# Patient Record
Sex: Female | Born: 2005 | Race: White | Hispanic: No | Marital: Single | Smoking: Never smoker
Health system: Southern US, Community
[De-identification: ages and names within clinical notes are randomized; demographics above are authoritative.]

## PROBLEM LIST (undated history)

## (undated) DIAGNOSIS — T1490XA Injury, unspecified, initial encounter: Secondary | ICD-10-CM

## (undated) DIAGNOSIS — N12 Tubulo-interstitial nephritis, not specified as acute or chronic: Secondary | ICD-10-CM

## (undated) DIAGNOSIS — F329 Major depressive disorder, single episode, unspecified: Secondary | ICD-10-CM

## (undated) HISTORY — DX: Tubulo-interstitial nephritis, not specified as acute or chronic: N12

## (undated) HISTORY — DX: Injury, unspecified, initial encounter: T14.90XA

---
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
## 2023-03-21 IMAGING — CT CT ABD-PELV W/ CM
2 of 4 series · 15 of 46 positions shown, 17 images · IV contrast (Omnipaque or Isovue)
Comparison: None.

CLINICAL DATA: Left flank pain, urinary tract infection

EXAM:
CT ABDOMEN AND PELVIS WITH CONTRAST
TECHNIQUE: Multidetector CT imaging of the abdomen and pelvis was performed
using the standard protocol following bolus administration of
intravenous contrast.
CONTRAST:  75mL OMNIPAQUE IOHEXOL 300 MG/ML  SOLN

[Series 2: axial st · axial · 0.73mm/px · z∈[+714,+1189]mm · 12 of 109 slices shown, 14 images]
[im 9/109  soft-tissue]
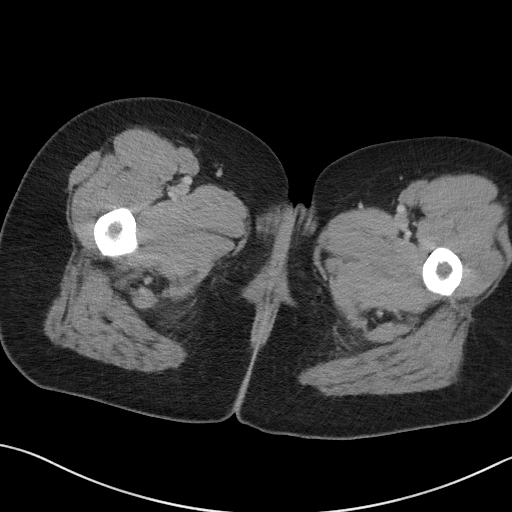
[im 9/109  bone]
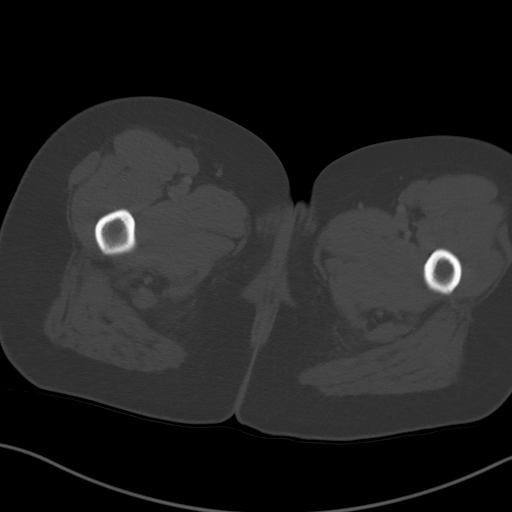
[im 18/109  soft-tissue]
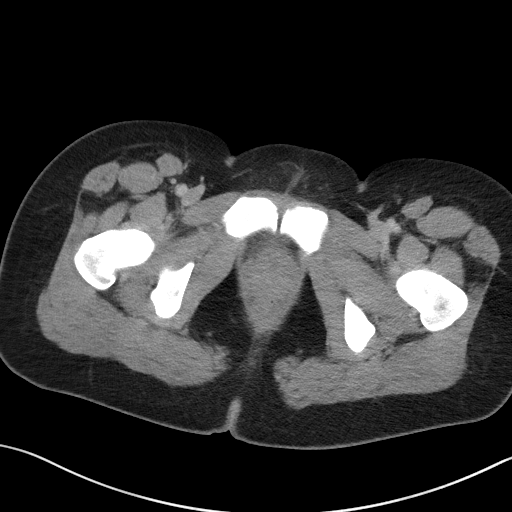
[im 26/109  soft-tissue]
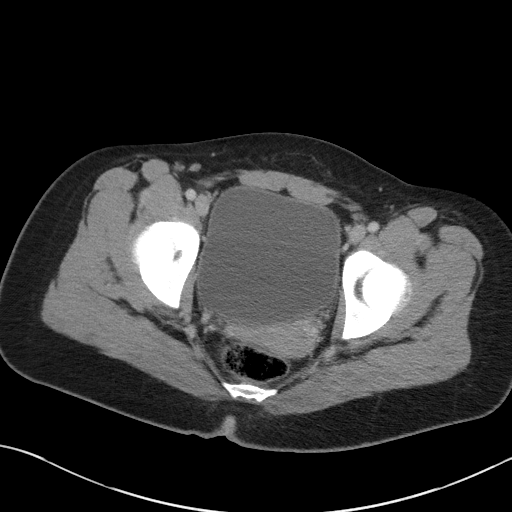
[im 35/109  soft-tissue]
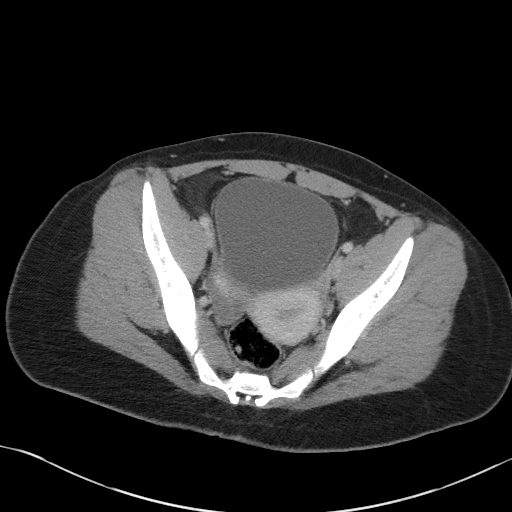
[im 44/109  soft-tissue]
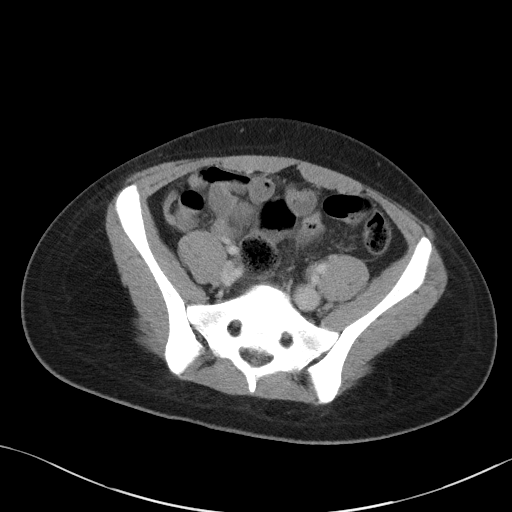
[im 52/109  soft-tissue]
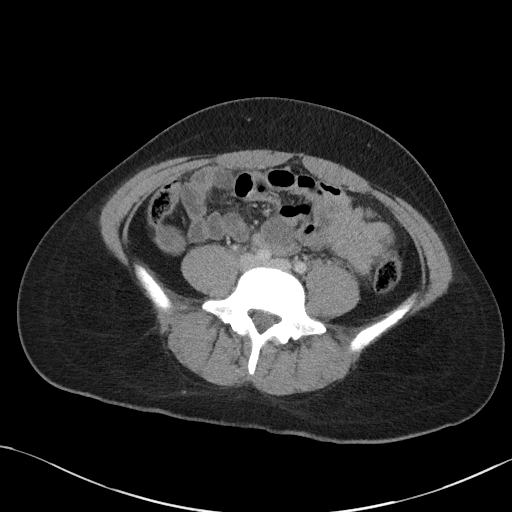
[im 61/109  soft-tissue]
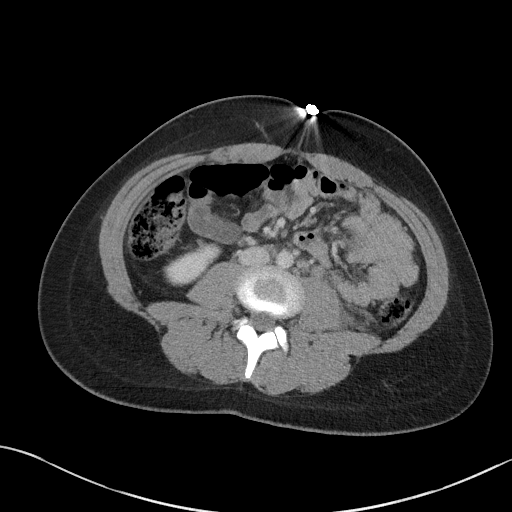
[im 70/109  soft-tissue]
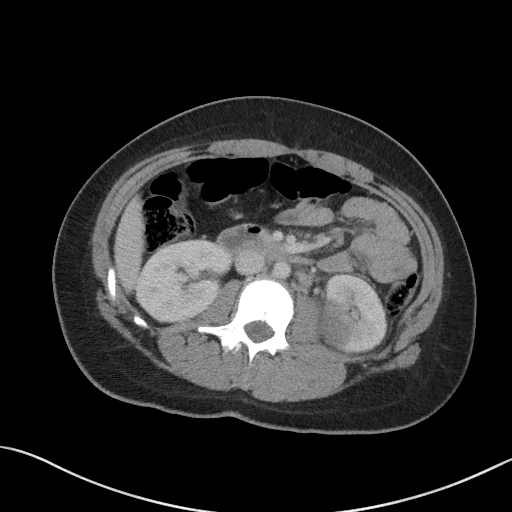
[im 78/109  soft-tissue]
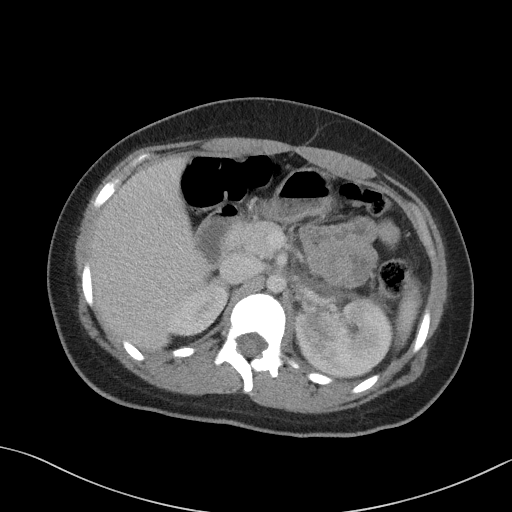
[im 78/109  bone]
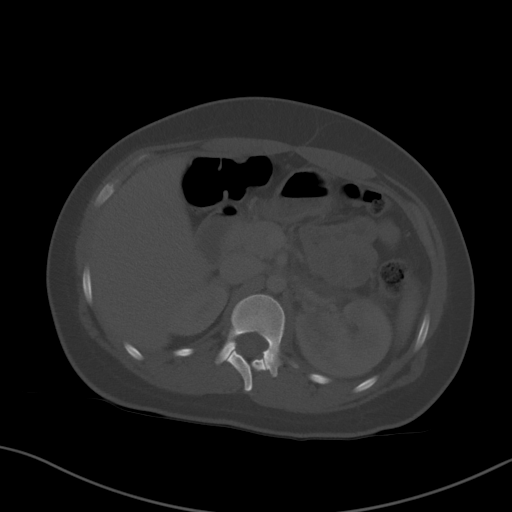
[im 87/109  soft-tissue]
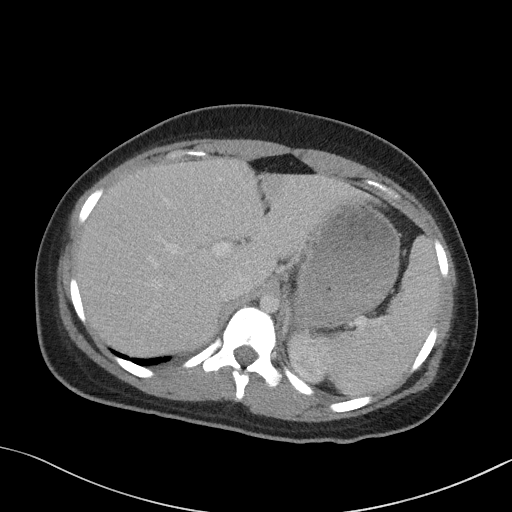
[im 96/109  soft-tissue]
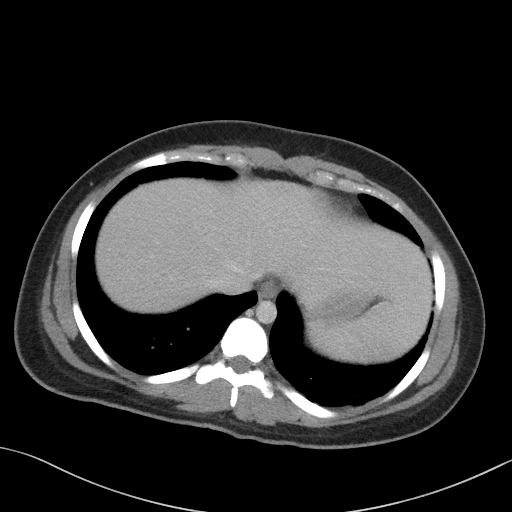
[im 104/109  soft-tissue]
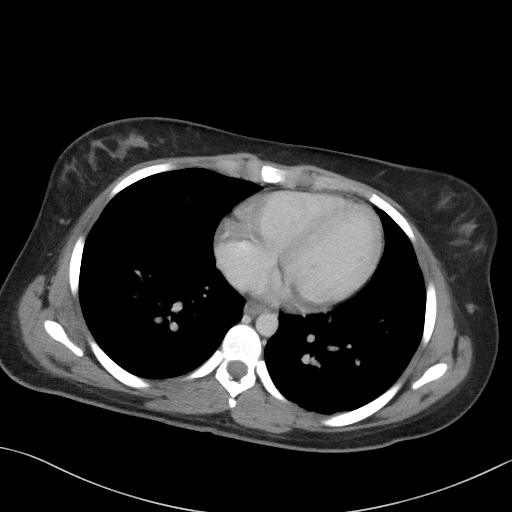

[Series 5: coronal st · coronal · 0.73mm/px · 3 of 104 slices shown]
[im 35/104  soft-tissue]
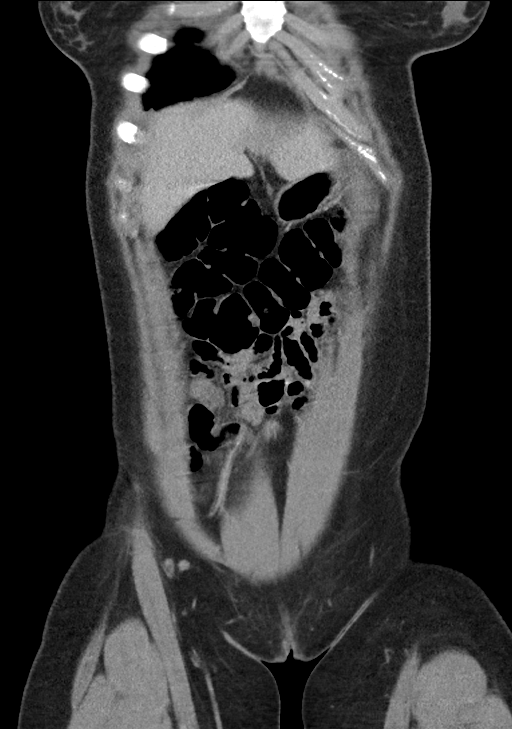
[im 46/104  soft-tissue]
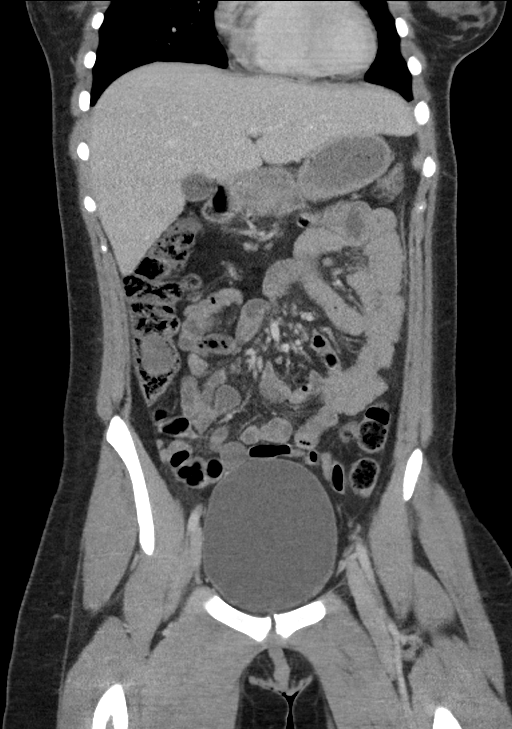
[im 58/104  soft-tissue]
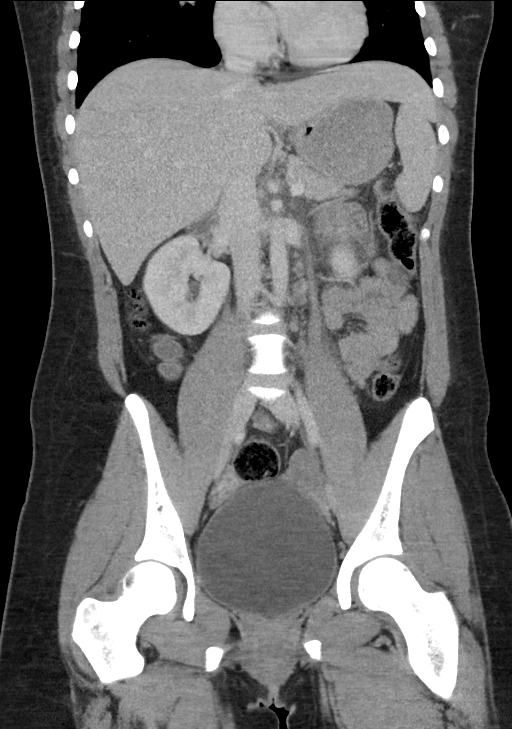

[15 of 46 positions shown; findings below may reference images not displayed]

FINDINGS: Lower chest: No acute pleural or parenchymal lung disease.

Hepatobiliary: No focal liver abnormality is seen. No gallstones,
gallbladder wall thickening, or biliary dilatation.

Pancreas: Unremarkable. No pancreatic ductal dilatation or
surrounding inflammatory changes.

Spleen: Normal in size without focal abnormality.

Adrenals/Urinary Tract: There is heterogeneous decreased enhancement
of the left kidney, greatest in the mid and lower pole, consistent
with pyelonephritis. Marked left perinephric fat stranding. No
evidence of abscess.

The right kidney enhances normally. No urinary tract calculi or
obstructive uropathy. The adrenals and bladder are unremarkable.

Stomach/Bowel: No bowel obstruction or ileus. Normal appendix right
lower quadrant. No bowel wall thickening or inflammatory change.

Vascular/Lymphatic: Multiple subcentimeter lymph nodes are seen in
the left para-aortic region, likely reactive given adjacent
pyelonephritis. No pathologically enlarged lymph nodes. No
significant vascular findings.

Reproductive: Uterus and bilateral adnexa are unremarkable.

Other: No free fluid or free gas.  No abdominal wall hernia.

Musculoskeletal: No acute or destructive bony lesions. Reconstructed
images demonstrate no additional findings.
IMPRESSION: 1. Acute left pyelonephritis, with areas of likely lobar nephronia
within the mid and lower poles of the left kidney. No evidence of
renal abscess.
2. No evidence of urinary tract calculi or obstructive uropathy.
3. Reactive subcentimeter lymph nodes in the left para-aortic
region. No pathologically enlarged nodes.

## 2023-05-20 ENCOUNTER — Encounter: Payer: Self-pay | Admitting: *Deleted

## 2023-05-28 ENCOUNTER — Ambulatory Visit: Payer: MEDICAID

## 2023-05-28 ENCOUNTER — Ambulatory Visit
Admission: EM | Admit: 2023-05-28 | Discharge: 2023-05-28 | Disposition: A | Discharge status: Home / Self Care | Payer: MEDICAID | Attending: Family Medicine | Admitting: Family Medicine

## 2023-05-28 DIAGNOSIS — M79641 Pain in right hand: Secondary | ICD-10-CM

## 2023-05-28 NOTE — ED Triage Notes (Signed)
Pt reports she punched the floor and the wall about an hour ago due to being angry.

## 2023-05-28 NOTE — Discharge Instructions (Signed)
We have placed a compression wrap to help with pain and stability of the area.  You may ice, elevate and take ibuprofen and Tylenol for pain.  I will call you tomorrow morning if anything comes back abnormal on your x-ray.

## 2023-05-28 NOTE — ED Provider Notes (Signed)
[facility]    CSN: [id number] Arrival date & time: 05/28/23  1846      History   Chief Complaint No chief complaint on file.   HPI Gabriela Allison is a 17 y.o. female.   Patient presenting today with 1 day history of right hand and wrist pain following punching a wall and a floor earlier today.  Denies loss of range of motion, discoloration, numbness, tingling, skin injury.  So far not tried anything over-the-counter for symptoms.    Past Medical History:  Diagnosis Date   Anxiety state 11/24/2018   MDD (major depressive disorder)    Nexplanon insertion 03/19/2021   Pyelonephritis     Patient Active Problem List   Diagnosis Date Noted   BMI 36.0-36.9,adult 10/28/2022   Pyelonephritis 04/01/2021   Nexplanon insertion 03/19/2021   Pregnancy test negative 02/12/2021   Frequent UTI 02/12/2021   Screen for STD (sexually transmitted disease) 02/12/2021   Encounter for initial prescription of contraceptive pills 02/12/2021   Major depressive disorder 10/31/2020   Suicidal ideation 06/20/2020   Self-injurious behavior 06/20/2020   MDD (major depressive disorder), recurrent severe, 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 No pertinent surgical history.  OB History     Gravida  0   Para  0   Term  0   Preterm  0   AB  0   Living  0      SAB  0   IAB  0   Ectopic  0   Multiple  0   Live Births  0            Home Medications    Prior to Admission medications   Medication Sig Start Date End Date Taking? Authorizing Provider  citalopram (CELEXA) 20 MG tablet Take 20 mg by mouth daily. 09/22/22   [provider]  cloNIDine (CATAPRES) 0.1 MG tablet Take 0.1 mg by mouth daily. 10/22/22 11/21/22  [provider]  etonogestrel (NEXPLANON) 68 MG IMPL implant 1 each by Subdermal route  once.    [provider]  lamoTRIgine (LAMICTAL) 25 MG tablet Take 25 mg by mouth 2 (two) times daily. 10/22/22 11/21/22  [provider]  Multiple Vitamin (MULTIVITAMIN) tablet Take 1 tablet by mouth daily.    [provider]    Family History Family History  Problem Relation Age of Onset   Other Mother        substance abuse   Other Father        substance abuse   Hypertension Maternal Grandmother    Heart disease Maternal Grandfather        ?cardiomegaly   Hypertension Paternal Grandfather    Asthma Paternal Grandfather    Hypothyroidism Paternal Grandmother    Migraines Paternal Grandmother    Healthy Sister    Healthy Sister    Seizures Neg Hx    Autism Neg Hx    ADD / ADHD Neg Hx    Anxiety disorder Neg Hx    Depression Neg Hx    Bipolar disorder Neg Hx    Schizophrenia Neg Hx     Social History Social History   Tobacco Use   Smoking status: Never   Smokeless tobacco: Never  Vaping Use   Vaping status: Never Used  Substance Use Topics   Alcohol use: Not Currently    Comment: occasionally  Drug use: Not Currently    Types: Marijuana    Comment: over a month ago     Allergies   Patient has no known allergies.   Review of Systems Review of Systems Per HPI  Physical Exam Triage Vital Signs ED Triage Vitals  Encounter Vitals Group     BP 05/28/23 1916 121/84     Systolic BP Percentile --      Diastolic BP Percentile --      Pulse Rate 05/28/23 1916 79     Resp 05/28/23 1916 20     Temp 05/28/23 1916 98.7 F (37.1 C)     Temp Source 05/28/23 1916 Oral     SpO2 05/28/23 1916 99 %     Weight --      Height --      Head Circumference --      Peak Flow --      Pain Score 05/28/23 1912 4     Pain Loc --      Pain Education --      Exclude from Growth Chart --    No data found.  Updated Vital Signs BP 121/84 (BP Location: Right Arm)   Pulse 79   Temp 98.7 F (37.1 C) (Oral)   Resp 20   LMP  (LMP Unknown)   SpO2  99%   Visual Acuity Right Eye Distance:   Left Eye Distance:   Bilateral Distance:    Right Eye Near:   Left Eye Near:    Bilateral Near:     Physical Exam Vitals and nursing note reviewed.  Constitutional:      Appearance: Normal appearance. She is not ill-appearing.  HENT:     Head: Atraumatic.  Eyes:     Extraocular Movements: Extraocular movements intact.     Conjunctiva/sclera: Conjunctivae normal.  Cardiovascular:     Rate and Rhythm: Normal rate and regular rhythm.     Heart sounds: Normal heart sounds.  Pulmonary:     Effort: Pulmonary effort is normal.     Breath sounds: Normal breath sounds.  Musculoskeletal:        General: Tenderness and signs of injury present. No swelling or deformity. Normal range of motion.     Cervical back: Normal range of motion and neck supple.     Comments: Mild tenderness to palpation to the metacarpals of the right hand diffusely, no point tenderness, no deformities palpable  Skin:    General: Skin is warm and dry.     Findings: No bruising or erythema.  Neurological:     Mental Status: She is alert and oriented to person, place, and time.     Motor: No weakness.     Gait: Gait normal.     Comments: Right upper extremity neurovascularly intact  Psychiatric:        Mood and Affect: Mood normal.        Thought Content: Thought content normal.        Judgment: Judgment normal.      UC Treatments / Results  Labs (all labs ordered are listed, but only abnormal results are displayed) Labs Reviewed - No data to display  EKG   Radiology No results found.  Procedures Procedures (including critical care time)  Medications Ordered in UC Medications - No data to display  Initial Impression / Assessment and Plan / UC Course  I have reviewed the triage vital signs and the nursing notes.  Pertinent labs & imaging results that were  available during my care of the patient were reviewed by me and considered in my medical decision  making (see chart for details).     *** Final Clinical Impressions(s) / UC Diagnoses   Final diagnoses:  Right hand pain     Discharge Instructions      We have placed a compression wrap to help with pain and stability of the area.  You may ice, elevate and take ibuprofen and Tylenol for pain.  I will call you tomorrow morning if anything comes back abnormal on your x-ray.   ED Prescriptions   None    PDMP not reviewed this encounter.

## 2024-01-02 ENCOUNTER — Inpatient Hospital Stay (HOSPITAL_COMMUNITY)
Admission: EM | Admit: 2024-01-02 | Discharge: 2024-01-07 | DRG: 448 | Disposition: A | Discharge status: Home / Self Care | Payer: MEDICAID | Attending: General Surgery | Admitting: General Surgery

## 2024-01-02 ENCOUNTER — Emergency Department (HOSPITAL_COMMUNITY): Payer: MEDICAID

## 2024-01-02 ENCOUNTER — Encounter (HOSPITAL_COMMUNITY): Payer: Self-pay | Admitting: Emergency Medicine

## 2024-01-02 ENCOUNTER — Other Ambulatory Visit: Payer: Self-pay

## 2024-01-02 DIAGNOSIS — Y92410 Unspecified street and highway as the place of occurrence of the external cause: Secondary | ICD-10-CM

## 2024-01-02 DIAGNOSIS — S30810A Abrasion of lower back and pelvis, initial encounter: Secondary | ICD-10-CM | POA: Diagnosis present

## 2024-01-02 DIAGNOSIS — S82891A Other fracture of right lower leg, initial encounter for closed fracture: Secondary | ICD-10-CM

## 2024-01-02 DIAGNOSIS — Y901 Blood alcohol level of 20-39 mg/100 ml: Secondary | ICD-10-CM | POA: Diagnosis present

## 2024-01-02 DIAGNOSIS — F411 Generalized anxiety disorder: Secondary | ICD-10-CM | POA: Diagnosis present

## 2024-01-02 DIAGNOSIS — M549 Dorsalgia, unspecified: Secondary | ICD-10-CM | POA: Diagnosis present

## 2024-01-02 DIAGNOSIS — R202 Paresthesia of skin: Secondary | ICD-10-CM | POA: Diagnosis present

## 2024-01-02 DIAGNOSIS — R200 Anesthesia of skin: Secondary | ICD-10-CM | POA: Diagnosis present

## 2024-01-02 DIAGNOSIS — S80211A Abrasion, right knee, initial encounter: Secondary | ICD-10-CM | POA: Diagnosis present

## 2024-01-02 DIAGNOSIS — D62 Acute posthemorrhagic anemia: Secondary | ICD-10-CM | POA: Diagnosis not present

## 2024-01-02 DIAGNOSIS — Z981 Arthrodesis status: Principal | ICD-10-CM

## 2024-01-02 DIAGNOSIS — S32018A Other fracture of first lumbar vertebra, initial encounter for closed fracture: Secondary | ICD-10-CM | POA: Diagnosis present

## 2024-01-02 DIAGNOSIS — Z23 Encounter for immunization: Secondary | ICD-10-CM | POA: Diagnosis not present

## 2024-01-02 DIAGNOSIS — Z825 Family history of asthma and other chronic lower respiratory diseases: Secondary | ICD-10-CM | POA: Diagnosis not present

## 2024-01-02 DIAGNOSIS — S80212A Abrasion, left knee, initial encounter: Secondary | ICD-10-CM | POA: Diagnosis present

## 2024-01-02 DIAGNOSIS — S40211A Abrasion of right shoulder, initial encounter: Secondary | ICD-10-CM | POA: Diagnosis present

## 2024-01-02 DIAGNOSIS — Z793 Long term (current) use of hormonal contraceptives: Secondary | ICD-10-CM

## 2024-01-02 DIAGNOSIS — R780 Finding of alcohol in blood: Secondary | ICD-10-CM | POA: Diagnosis present

## 2024-01-02 DIAGNOSIS — S82841A Displaced bimalleolar fracture of right lower leg, initial encounter for closed fracture: Secondary | ICD-10-CM | POA: Diagnosis present

## 2024-01-02 DIAGNOSIS — D72829 Elevated white blood cell count, unspecified: Secondary | ICD-10-CM | POA: Diagnosis present

## 2024-01-02 DIAGNOSIS — F1090 Alcohol use, unspecified, uncomplicated: Secondary | ICD-10-CM | POA: Diagnosis present

## 2024-01-02 DIAGNOSIS — Z978 Presence of other specified devices: Secondary | ICD-10-CM

## 2024-01-02 DIAGNOSIS — Z8249 Family history of ischemic heart disease and other diseases of the circulatory system: Secondary | ICD-10-CM

## 2024-01-02 DIAGNOSIS — S32010A Wedge compression fracture of first lumbar vertebra, initial encounter for closed fracture: Secondary | ICD-10-CM | POA: Diagnosis present

## 2024-01-02 DIAGNOSIS — S50311A Abrasion of right elbow, initial encounter: Secondary | ICD-10-CM | POA: Diagnosis present

## 2024-01-02 LAB — COMPREHENSIVE METABOLIC PANEL WITH GFR
ALT: 23 U/L (ref 0–44)
AST: 35 U/L (ref 15–41)
Albumin: 4.4 g/dL (ref 3.5–5.0)
Alkaline Phosphatase: 43 U/L (ref 38–126)
Anion gap: 13 (ref 5–15)
BUN: 8 mg/dL (ref 6–20)
CO2: 18 mmol/L — ABNORMAL LOW (ref 22–32)
Calcium: 9.4 mg/dL (ref 8.9–10.3)
Chloride: 105 mmol/L (ref 98–111)
Creatinine, Ser: 0.73 mg/dL (ref 0.44–1.00)
GFR, Estimated: >60 mL/min (ref >60)
Glucose, Bld: 159 mg/dL — ABNORMAL HIGH (ref 70–99)
Potassium: 3.5 mmol/L (ref 3.5–5.1)
Sodium: 136 mmol/L (ref 135–145)
Total Bilirubin: 0.8 mg/dL (ref 0.0–1.2)
Total Protein: 7 g/dL (ref 6.5–8.1)

## 2024-01-02 LAB — I-STAT CHEM 8, ED
BUN: 8 mg/dL (ref 6–20)
Calcium, Ion: 1.17 mmol/L (ref 1.15–1.40)
Chloride: 105 mmol/L (ref 98–111)
Creatinine, Ser: 0.7 mg/dL (ref 0.44–1.00)
Glucose, Bld: 150 mg/dL — ABNORMAL HIGH (ref 70–99)
HCT: 51 % — ABNORMAL HIGH (ref 36.0–46.0)
Hemoglobin: 17.3 g/dL — ABNORMAL HIGH (ref 12.0–15.0)
Potassium: 3.6 mmol/L (ref 3.5–5.1)
Sodium: 139 mmol/L (ref 135–145)
TCO2: 18 mmol/L — ABNORMAL LOW (ref 22–32)

## 2024-01-02 LAB — CBC
HCT: 45.5 % (ref 36.0–46.0)
Hemoglobin: 15.7 g/dL — ABNORMAL HIGH (ref 12.0–15.0)
MCH: 32 pg (ref 26.0–34.0)
MCHC: 34.5 g/dL (ref 30.0–36.0)
MCV: 92.7 fL (ref 80.0–100.0)
Platelets: 277 10*3/uL (ref 150–400)
RBC: 4.91 MIL/uL (ref 3.87–5.11)
RDW: 12.2 % (ref 11.5–15.5)
WBC: 18 10*3/uL — ABNORMAL HIGH (ref 4.0–10.5)
nRBC: 0 % (ref 0.0–0.2)

## 2024-01-02 LAB — SAMPLE TO BLOOD BANK

## 2024-01-02 LAB — I-STAT CG4 LACTIC ACID, ED: Lactic Acid, Venous: 3.3 mmol/L (ref 0.5–1.9)

## 2024-01-02 LAB — PROTIME-INR
INR: 1 (ref 0.8–1.2)
Prothrombin Time: 13.2 s (ref 11.4–15.2)

## 2024-01-02 LAB — HCG, SERUM, QUALITATIVE: Preg, Serum: NEGATIVE

## 2024-01-02 LAB — ETHANOL: Alcohol, Ethyl (B): 28 mg/dL — ABNORMAL HIGH (ref <15)

## 2024-01-02 LAB — HIV ANTIBODY (ROUTINE TESTING W REFLEX): HIV Screen 4th Generation wRfx: NONREACTIVE

## 2024-01-02 MED ORDER — ONDANSETRON HCL 4 MG/2ML IJ SOLN
4.0000 mg | Freq: Four times a day (QID) | INTRAMUSCULAR | Status: DC | PRN
  Administered 2024-01-02 – 2024-01-03 (×2): 4 mg via INTRAVENOUS
  Filled 2024-01-02 (×2): qty 2

## 2024-01-02 MED ORDER — HYDRALAZINE HCL 20 MG/ML IJ SOLN: 10.0000 mg | INTRAMUSCULAR | Status: DC | PRN

## 2024-01-02 MED ORDER — FENTANYL CITRATE PF 50 MCG/ML IJ SOSY
PREFILLED_SYRINGE | INTRAMUSCULAR | Status: AC
  Filled 2024-01-02: qty 1

## 2024-01-02 MED ORDER — PANTOPRAZOLE SODIUM 40 MG IV SOLR
40.0000 mg | Freq: Every day | INTRAVENOUS | Status: DC
  Administered 2024-01-02: 40 mg via INTRAVENOUS
  Filled 2024-01-02: qty 10

## 2024-01-02 MED ORDER — HYDROMORPHONE HCL 1 MG/ML IJ SOLN
0.5000 mg | INTRAMUSCULAR | Status: DC | PRN
  Administered 2024-01-02 – 2024-01-03 (×4): 0.5 mg via INTRAVENOUS
  Filled 2024-01-02 (×4): qty 0.5

## 2024-01-02 MED ORDER — ONDANSETRON HCL 4 MG/2ML IJ SOLN
INTRAMUSCULAR | Status: AC
Start: 2024-01-02 — End: ?
  Filled 2024-01-02: qty 2

## 2024-01-02 MED ORDER — OXYCODONE HCL 5 MG PO TABS
10.0000 mg | ORAL_TABLET | ORAL | Status: DC | PRN
  Administered 2024-01-03 – 2024-01-06 (×10): 10 mg via ORAL
  Filled 2024-01-02 (×12): qty 2

## 2024-01-02 MED ORDER — METHOCARBAMOL 1000 MG/10ML IJ SOLN
1000.0000 mg | Freq: Three times a day (TID) | INTRAMUSCULAR | Status: DC
  Administered 2024-01-02 – 2024-01-03 (×2): 1000 mg via INTRAVENOUS
  Filled 2024-01-02 (×2): qty 10

## 2024-01-02 MED ORDER — ONDANSETRON HCL 4 MG/2ML IJ SOLN
4.0000 mg | Freq: Once | INTRAMUSCULAR | Status: AC
  Administered 2024-01-02: 4 mg via INTRAVENOUS

## 2024-01-02 MED ORDER — IOHEXOL 350 MG/ML SOLN
75.0000 mL | Freq: Once | INTRAVENOUS | Status: AC | PRN
  Administered 2024-01-02: 75 mL via INTRAVENOUS

## 2024-01-02 MED ORDER — FENTANYL CITRATE PF 50 MCG/ML IJ SOSY
PREFILLED_SYRINGE | INTRAMUSCULAR | Status: AC | PRN
  Administered 2024-01-02: 50 ug via INTRAVENOUS

## 2024-01-02 MED ORDER — FENTANYL CITRATE PF 50 MCG/ML IJ SOSY
100.0000 ug | PREFILLED_SYRINGE | Freq: Once | INTRAMUSCULAR | Status: AC
  Administered 2024-01-02: 100 ug via INTRAVENOUS
  Filled 2024-01-02: qty 2

## 2024-01-02 MED ORDER — GABAPENTIN 300 MG PO CAPS
300.0000 mg | ORAL_CAPSULE | Freq: Three times a day (TID) | ORAL | Status: DC
  Administered 2024-01-02 – 2024-01-06 (×10): 300 mg via ORAL
  Filled 2024-01-02: qty 1
  Filled 2024-01-02: qty 3
  Filled 2024-01-02 (×8): qty 1

## 2024-01-02 MED ORDER — DOCUSATE SODIUM 100 MG PO CAPS
100.0000 mg | ORAL_CAPSULE | Freq: Two times a day (BID) | ORAL | Status: DC
  Administered 2024-01-02 – 2024-01-07 (×8): 100 mg via ORAL
  Filled 2024-01-02 (×10): qty 1

## 2024-01-02 MED ORDER — ONDANSETRON 4 MG PO TBDP: 4.0000 mg | ORAL_TABLET | Freq: Four times a day (QID) | ORAL | Status: DC | PRN

## 2024-01-02 MED ORDER — POLYETHYLENE GLYCOL 3350 17 G PO PACK: 17.0000 g | PACK | Freq: Every day | ORAL | Status: DC | PRN

## 2024-01-02 MED ORDER — ACETAMINOPHEN 500 MG PO TABS
1000.0000 mg | ORAL_TABLET | Freq: Four times a day (QID) | ORAL | Status: DC
  Administered 2024-01-02 – 2024-01-03 (×3): 1000 mg via ORAL
  Filled 2024-01-02 (×3): qty 2

## 2024-01-02 MED ORDER — LACTATED RINGERS IV SOLN: INTRAVENOUS | Status: AC

## 2024-01-02 MED ORDER — ENOXAPARIN SODIUM 30 MG/0.3ML IJ SOSY
30.0000 mg | PREFILLED_SYRINGE | Freq: Two times a day (BID) | INTRAMUSCULAR | Status: DC
  Administered 2024-01-04 – 2024-01-07 (×6): 30 mg via SUBCUTANEOUS
  Filled 2024-01-02 (×6): qty 0.3

## 2024-01-02 MED ORDER — PANTOPRAZOLE SODIUM 40 MG PO TBEC
40.0000 mg | DELAYED_RELEASE_TABLET | Freq: Every day | ORAL | Status: DC
  Administered 2024-01-03 – 2024-01-07 (×5): 40 mg via ORAL
  Filled 2024-01-02 (×5): qty 1

## 2024-01-02 MED ORDER — OXYCODONE HCL 5 MG PO TABS
5.0000 mg | ORAL_TABLET | ORAL | Status: DC | PRN
  Administered 2024-01-02 – 2024-01-04 (×2): 5 mg via ORAL
  Filled 2024-01-02 (×2): qty 1

## 2024-01-02 MED ORDER — SODIUM CHLORIDE 0.9 % IV SOLN
INTRAVENOUS | Status: AC | PRN
  Administered 2024-01-02: 1000 mL via INTRAVENOUS

## 2024-01-02 MED ORDER — TETANUS-DIPHTH-ACELL PERTUSSIS 5-2.5-18.5 LF-MCG/0.5 IM SUSY
0.5000 mL | PREFILLED_SYRINGE | Freq: Once | INTRAMUSCULAR | Status: AC
  Administered 2024-01-02: 0.5 mL via INTRAMUSCULAR

## 2024-01-02 NOTE — Progress Notes (Signed)
 Orthopedic Tech Progress Note Patient Details:  Gabriela Allison [birth year] [id number]  Responded to level 1 trauma, not needed at this time  Patient ID: Gabriela Allison, female   DOB: [birth year], 18 y.o.   MRN: [id number]  Gabriela Allison 01/02/2024, 6:22 PM
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
## 2024-01-02 NOTE — Progress Notes (Signed)
 Orthopedic Tech Progress Note Patient Details:  Gabriela Allison 04/17/2006 [id number] Posterior and stirrup splint applied to patients RLE.   Ortho Devices Type of Ortho Device: Short leg splint, Stirrup splint Ortho Device/Splint Location: LRE Ortho Device/Splint Interventions: Ordered, Application   Post Interventions Patient Tolerated: Well  Nels Banda 01/02/2024, 7:05 PM

## 2024-01-02 NOTE — ED Notes (Signed)
 Patient transported to CT
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
## 2024-01-02 NOTE — ED Notes (Signed)
 Ortho tech at bedside applying ankle splint.
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
## 2024-01-02 NOTE — Progress Notes (Signed)
   01/02/24 1745  Spiritual Encounters  Type of Visit Attempt (pt unavailable)  Care provided to: Pt not available  Conversation partners present during encounter Nurse;Physician  Referral source Trauma page  Reason for visit Trauma  OnCall Visit Yes    Chaplain responded to level 1 page. Pt unavailable and no family present. Chaplain will continue to monitor and remains available.

## 2024-01-03 ENCOUNTER — Inpatient Hospital Stay (HOSPITAL_COMMUNITY): Payer: Self-pay | Admitting: Anesthesiology

## 2024-01-03 ENCOUNTER — Other Ambulatory Visit: Payer: Self-pay | Admitting: Orthopaedic Surgery

## 2024-01-03 ENCOUNTER — Other Ambulatory Visit: Payer: Self-pay

## 2024-01-03 ENCOUNTER — Encounter (HOSPITAL_COMMUNITY): Admission: EM | Disposition: A | Discharge status: Home / Self Care | Payer: Self-pay | Source: Home / Self Care

## 2024-01-03 ENCOUNTER — Inpatient Hospital Stay (HOSPITAL_COMMUNITY): Payer: Self-pay

## 2024-01-03 ENCOUNTER — Encounter (HOSPITAL_COMMUNITY): Payer: Self-pay | Admitting: General Surgery

## 2024-01-03 DIAGNOSIS — Z981 Arthrodesis status: Secondary | ICD-10-CM

## 2024-01-03 DIAGNOSIS — S32038A Other fracture of third lumbar vertebra, initial encounter for closed fracture: Secondary | ICD-10-CM

## 2024-01-03 HISTORY — PX: LUMBAR PERCUTANEOUS PEDICLE SCREW 1 LEVEL: SHX5560

## 2024-01-03 LAB — CBC
HCT: 38 % (ref 36.0–46.0)
Hemoglobin: 13.2 g/dL (ref 12.0–15.0)
MCH: 32.3 pg (ref 26.0–34.0)
MCHC: 34.7 g/dL (ref 30.0–36.0)
MCV: 92.9 fL (ref 80.0–100.0)
Platelets: 199 10*3/uL (ref 150–400)
RBC: 4.09 MIL/uL (ref 3.87–5.11)
RDW: 12.3 % (ref 11.5–15.5)
WBC: 18.9 10*3/uL — ABNORMAL HIGH (ref 4.0–10.5)
nRBC: 0 % (ref 0.0–0.2)

## 2024-01-03 LAB — BASIC METABOLIC PANEL WITH GFR
Anion gap: 10 (ref 5–15)
BUN: 6 mg/dL (ref 6–20)
CO2: 20 mmol/L — ABNORMAL LOW (ref 22–32)
Calcium: 8.7 mg/dL — ABNORMAL LOW (ref 8.9–10.3)
Chloride: 109 mmol/L (ref 98–111)
Creatinine, Ser: 0.59 mg/dL (ref 0.44–1.00)
GFR, Estimated: >60 mL/min (ref >60)
Glucose, Bld: 103 mg/dL — ABNORMAL HIGH (ref 70–99)
Potassium: 3.8 mmol/L (ref 3.5–5.1)
Sodium: 139 mmol/L (ref 135–145)

## 2024-01-03 SURGERY — LUMBAR PERCUTANEOUS PEDICLE SCREW 1 LEVEL
Anesthesia: General | Site: Spine Lumbar

## 2024-01-03 MED ORDER — FENTANYL CITRATE (PF) 100 MCG/2ML IJ SOLN
INTRAMUSCULAR | Status: AC
  Filled 2024-01-03: qty 2

## 2024-01-03 MED ORDER — ONDANSETRON HCL 4 MG/2ML IJ SOLN
INTRAMUSCULAR | Status: AC
  Filled 2024-01-03: qty 2

## 2024-01-03 MED ORDER — SODIUM CHLORIDE 0.9% FLUSH
3.0000 mL | Freq: Two times a day (BID) | INTRAVENOUS | Status: DC
  Administered 2024-01-03 – 2024-01-07 (×8): 3 mL via INTRAVENOUS

## 2024-01-03 MED ORDER — BACITRACIN ZINC 500 UNIT/GM EX OINT
TOPICAL_OINTMENT | Freq: Every day | CUTANEOUS | Status: DC
  Administered 2024-01-03 – 2024-01-07 (×4): 31.5 via TOPICAL
  Filled 2024-01-03: qty 28.4

## 2024-01-03 MED ORDER — PHENYLEPHRINE 80 MCG/ML (10ML) SYRINGE FOR IV PUSH (FOR BLOOD PRESSURE SUPPORT)
PREFILLED_SYRINGE | INTRAVENOUS | Status: DC | PRN
  Administered 2024-01-03 (×2): 80 ug via INTRAVENOUS

## 2024-01-03 MED ORDER — THROMBIN 5000 UNITS EX KIT
PACK | CUTANEOUS | Status: AC
  Filled 2024-01-03: qty 1

## 2024-01-03 MED ORDER — HYDROMORPHONE HCL 1 MG/ML IJ SOLN
0.5000 mg | INTRAMUSCULAR | Status: DC | PRN
  Administered 2024-01-03 – 2024-01-05 (×13): 0.5 mg via INTRAVENOUS
  Filled 2024-01-03 (×13): qty 0.5

## 2024-01-03 MED ORDER — DEXAMETHASONE SODIUM PHOSPHATE 10 MG/ML IJ SOLN
INTRAMUSCULAR | Status: DC | PRN
  Administered 2024-01-03: 5 mg via INTRAVENOUS

## 2024-01-03 MED ORDER — MENTHOL 3 MG MT LOZG: 1.0000 | LOZENGE | OROMUCOSAL | Status: DC | PRN

## 2024-01-03 MED ORDER — ONDANSETRON HCL 4 MG/2ML IJ SOLN
4.0000 mg | Freq: Four times a day (QID) | INTRAMUSCULAR | Status: DC | PRN
  Administered 2024-01-03: 4 mg via INTRAVENOUS
  Filled 2024-01-03: qty 2

## 2024-01-03 MED ORDER — THROMBIN 20000 UNITS EX SOLR
CUTANEOUS | Status: DC | PRN
  Administered 2024-01-03: 20 mL via TOPICAL

## 2024-01-03 MED ORDER — PHENYLEPHRINE 80 MCG/ML (10ML) SYRINGE FOR IV PUSH (FOR BLOOD PRESSURE SUPPORT)
PREFILLED_SYRINGE | INTRAVENOUS | Status: AC
  Filled 2024-01-03: qty 10

## 2024-01-03 MED ORDER — DEXAMETHASONE SODIUM PHOSPHATE 10 MG/ML IJ SOLN
INTRAMUSCULAR | Status: AC
  Filled 2024-01-03: qty 1

## 2024-01-03 MED ORDER — FENTANYL CITRATE (PF) 100 MCG/2ML IJ SOLN
25.0000 ug | INTRAMUSCULAR | Status: DC | PRN
Start: 2024-01-03 — End: 2024-01-03
  Administered 2024-01-03 (×2): 50 ug via INTRAVENOUS

## 2024-01-03 MED ORDER — CELECOXIB 200 MG PO CAPS
200.0000 mg | ORAL_CAPSULE | Freq: Two times a day (BID) | ORAL | Status: DC
  Administered 2024-01-03 – 2024-01-07 (×8): 200 mg via ORAL
  Filled 2024-01-03 (×9): qty 1

## 2024-01-03 MED ORDER — BUPIVACAINE HCL (PF) 0.25 % IJ SOLN
INTRAMUSCULAR | Status: AC
  Filled 2024-01-03: qty 30

## 2024-01-03 MED ORDER — MUPIROCIN 2 % EX OINT
1.0000 | TOPICAL_OINTMENT | Freq: Two times a day (BID) | CUTANEOUS | Status: AC
Start: 2024-01-03 — End: 2024-01-05
  Administered 2024-01-03 – 2024-01-05 (×5): 1 via NASAL
  Filled 2024-01-03 (×2): qty 22

## 2024-01-03 MED ORDER — PHENOL 1.4 % MT LIQD: 1.0000 | OROMUCOSAL | Status: DC | PRN

## 2024-01-03 MED ORDER — SODIUM CHLORIDE 0.9 % IV SOLN
250.0000 mL | INTRAVENOUS | Status: AC
Start: 2024-01-03 — End: 2024-01-04

## 2024-01-03 MED ORDER — MIDAZOLAM HCL 2 MG/2ML IJ SOLN
INTRAMUSCULAR | Status: AC
  Filled 2024-01-03: qty 2

## 2024-01-03 MED ORDER — METHOCARBAMOL 500 MG PO TABS
500.0000 mg | ORAL_TABLET | Freq: Four times a day (QID) | ORAL | Status: DC | PRN
  Administered 2024-01-03 – 2024-01-04 (×2): 500 mg via ORAL
  Filled 2024-01-03 (×2): qty 1

## 2024-01-03 MED ORDER — ACETAMINOPHEN 500 MG PO TABS
1000.0000 mg | ORAL_TABLET | Freq: Four times a day (QID) | ORAL | Status: DC
  Administered 2024-01-03 – 2024-01-04 (×3): 1000 mg via ORAL
  Filled 2024-01-03 (×3): qty 2

## 2024-01-03 MED ORDER — LIDOCAINE 2% (20 MG/ML) 5 ML SYRINGE
INTRAMUSCULAR | Status: DC | PRN
  Administered 2024-01-03: 60 mg via INTRAVENOUS

## 2024-01-03 MED ORDER — FENTANYL CITRATE (PF) 250 MCG/5ML IJ SOLN
INTRAMUSCULAR | Status: AC
Start: 2024-01-03 — End: ?
  Filled 2024-01-03: qty 5

## 2024-01-03 MED ORDER — MIDAZOLAM HCL 2 MG/2ML IJ SOLN
INTRAMUSCULAR | Status: DC | PRN
  Administered 2024-01-03: 2 mg via INTRAVENOUS

## 2024-01-03 MED ORDER — ORAL CARE MOUTH RINSE: 15.0000 mL | OROMUCOSAL | Status: DC | PRN

## 2024-01-03 MED ORDER — SODIUM CHLORIDE 0.9 % IV SOLN: 12.5000 mg | INTRAVENOUS | Status: DC | PRN

## 2024-01-03 MED ORDER — OXYCODONE HCL 5 MG PO TABS: 5.0000 mg | ORAL_TABLET | Freq: Once | ORAL | Status: DC | PRN

## 2024-01-03 MED ORDER — THROMBIN 20000 UNITS EX SOLR
CUTANEOUS | Status: AC
  Filled 2024-01-03: qty 20000

## 2024-01-03 MED ORDER — POTASSIUM CHLORIDE IN NACL 20-0.9 MEQ/L-% IV SOLN
INTRAVENOUS | Status: AC
  Filled 2024-01-03 (×2): qty 1000

## 2024-01-03 MED ORDER — OXYCODONE HCL 5 MG/5ML PO SOLN: 5.0000 mg | Freq: Once | ORAL | Status: DC | PRN

## 2024-01-03 MED ORDER — AMISULPRIDE (ANTIEMETIC) 5 MG/2ML IV SOLN: 10.0000 mg | Freq: Once | INTRAVENOUS | Status: DC | PRN

## 2024-01-03 MED ORDER — ROCURONIUM BROMIDE 10 MG/ML (PF) SYRINGE
PREFILLED_SYRINGE | INTRAVENOUS | Status: DC | PRN
  Administered 2024-01-03: 50 mg via INTRAVENOUS
  Administered 2024-01-03: 10 mg via INTRAVENOUS

## 2024-01-03 MED ORDER — 0.9 % SODIUM CHLORIDE (POUR BTL) OPTIME
TOPICAL | Status: DC | PRN
  Administered 2024-01-03: 1000 mL

## 2024-01-03 MED ORDER — PROPOFOL 10 MG/ML IV BOLUS
INTRAVENOUS | Status: AC
  Filled 2024-01-03: qty 20

## 2024-01-03 MED ORDER — SUGAMMADEX SODIUM 200 MG/2ML IV SOLN
INTRAVENOUS | Status: DC | PRN
  Administered 2024-01-03: 150 mg via INTRAVENOUS

## 2024-01-03 MED ORDER — PROPOFOL 10 MG/ML IV BOLUS
INTRAVENOUS | Status: DC | PRN
  Administered 2024-01-03: 200 mg via INTRAVENOUS

## 2024-01-03 MED ORDER — SODIUM CHLORIDE 0.9% FLUSH: 3.0000 mL | INTRAVENOUS | Status: DC | PRN

## 2024-01-03 MED ORDER — THROMBIN 5000 UNITS EX SOLR
OROMUCOSAL | Status: DC | PRN
  Administered 2024-01-03: 5 mL via TOPICAL

## 2024-01-03 MED ORDER — CEFAZOLIN SODIUM-DEXTROSE 2-4 GM/100ML-% IV SOLN
2.0000 g | INTRAVENOUS | Status: DC
  Filled 2024-01-03: qty 100

## 2024-01-03 MED ORDER — DEXMEDETOMIDINE HCL IN NACL 80 MCG/20ML IV SOLN
INTRAVENOUS | Status: DC | PRN
Start: 2024-01-03 — End: 2024-01-03
  Administered 2024-01-03 (×2): 8 ug via INTRAVENOUS
  Administered 2024-01-03: 4 ug via INTRAVENOUS

## 2024-01-03 MED ORDER — BUPIVACAINE HCL (PF) 0.25 % IJ SOLN
INTRAMUSCULAR | Status: DC | PRN
  Administered 2024-01-03: 10 mL

## 2024-01-03 MED ORDER — ONDANSETRON HCL 4 MG/2ML IJ SOLN
INTRAMUSCULAR | Status: DC | PRN
  Administered 2024-01-03: 4 mg via INTRAVENOUS

## 2024-01-03 MED ORDER — ACETAMINOPHEN 500 MG PO TABS: 1000.0000 mg | ORAL_TABLET | Freq: Once | ORAL | Status: DC

## 2024-01-03 MED ORDER — SENNA 8.6 MG PO TABS
1.0000 | ORAL_TABLET | Freq: Two times a day (BID) | ORAL | Status: DC
  Administered 2024-01-03 – 2024-01-07 (×7): 8.6 mg via ORAL
  Filled 2024-01-03 (×8): qty 1

## 2024-01-03 MED ORDER — POVIDONE-IODINE 10 % EX SWAB: 2.0000 | Freq: Once | CUTANEOUS | Status: DC

## 2024-01-03 MED ORDER — ONDANSETRON HCL 4 MG PO TABS
4.0000 mg | ORAL_TABLET | Freq: Four times a day (QID) | ORAL | Status: DC | PRN
  Administered 2024-01-03: 4 mg via ORAL
  Filled 2024-01-03: qty 1

## 2024-01-03 MED ORDER — CEFAZOLIN SODIUM-DEXTROSE 2-4 GM/100ML-% IV SOLN
2.0000 g | Freq: Three times a day (TID) | INTRAVENOUS | Status: AC
  Administered 2024-01-03 (×2): 2 g via INTRAVENOUS
  Filled 2024-01-03 (×2): qty 100

## 2024-01-03 MED ORDER — FENTANYL CITRATE (PF) 250 MCG/5ML IJ SOLN
INTRAMUSCULAR | Status: DC | PRN
  Administered 2024-01-03 (×5): 50 ug via INTRAVENOUS

## 2024-01-03 MED ORDER — MELATONIN 3 MG PO TABS
3.0000 mg | ORAL_TABLET | Freq: Every day | ORAL | Status: DC
  Administered 2024-01-03 – 2024-01-06 (×4): 3 mg via ORAL
  Filled 2024-01-03 (×4): qty 1

## 2024-01-03 MED ORDER — ALBUMIN HUMAN 5 % IV SOLN: INTRAVENOUS | Status: DC | PRN

## 2024-01-03 SURGICAL SUPPLY — 44 items
ALLOGRAFT BONE FIBER KORE 5 (Bone Implant) IMPLANT
ALLOGRAFT BONESTRIP KORE 2.5X5 (Bone Implant) IMPLANT
BAG COUNTER SPONGE SURGICOUNT (BAG) ×1 IMPLANT
BAND RUBBER #18 3X1/16 STRL (MISCELLANEOUS) ×2 IMPLANT
BENZOIN TINCTURE PRP APPL 2/3 (GAUZE/BANDAGES/DRESSINGS) ×1 IMPLANT
BUR CARBIDE MATCH 3.0 (BURR) ×1 IMPLANT
CANISTER SUCT 3000ML PPV (MISCELLANEOUS) ×1 IMPLANT
DRAPE LAPAROTOMY 100X72X124 (DRAPES) ×1 IMPLANT
DRAPE MICROSCOPE SLANT 54X150 (MISCELLANEOUS) ×1 IMPLANT
DRAPE SURG 17X23 STRL (DRAPES) ×1 IMPLANT
DRSG OPSITE POSTOP 4X8 (GAUZE/BANDAGES/DRESSINGS) IMPLANT
DURAPREP 26ML APPLICATOR (WOUND CARE) ×1 IMPLANT
ELECTRODE REM PT RTRN 9FT ADLT (ELECTROSURGICAL) ×1 IMPLANT
GAUZE 4X4 16PLY ~~LOC~~+RFID DBL (SPONGE) IMPLANT
GLOVE BIO SURGEON STRL SZ7 (GLOVE) IMPLANT
GLOVE BIO SURGEON STRL SZ8 (GLOVE) ×1 IMPLANT
GLOVE BIOGEL PI IND STRL 7.0 (GLOVE) IMPLANT
GOWN STRL REUS W/ TWL LRG LVL3 (GOWN DISPOSABLE) IMPLANT
GOWN STRL REUS W/ TWL XL LVL3 (GOWN DISPOSABLE) ×1 IMPLANT
GOWN STRL REUS W/TWL 2XL LVL3 (GOWN DISPOSABLE) IMPLANT
HEMOSTAT POWDER KIT SURGIFOAM (HEMOSTASIS) ×1 IMPLANT
KIT BASIN OR (CUSTOM PROCEDURE TRAY) ×1 IMPLANT
KIT TURNOVER KIT B (KITS) ×1 IMPLANT
NDL HYPO 25X1 1.5 SAFETY (NEEDLE) ×1 IMPLANT
NDL SPNL 20GX3.5 QUINCKE YW (NEEDLE) IMPLANT
NEEDLE HYPO 25X1 1.5 SAFETY (NEEDLE) ×1 IMPLANT
NEEDLE SPNL 20GX3.5 QUINCKE YW (NEEDLE) IMPLANT
NS IRRIG 1000ML POUR BTL (IV SOLUTION) ×1 IMPLANT
PACK LAMINECTOMY NEURO (CUSTOM PROCEDURE TRAY) ×1 IMPLANT
PAD ARMBOARD POSITIONER FOAM (MISCELLANEOUS) ×3 IMPLANT
PATTIES SURGICAL .5 X.5 (GAUZE/BANDAGES/DRESSINGS) ×1 IMPLANT
PATTIES SURGICAL 1X1 (DISPOSABLE) ×1 IMPLANT
ROD LORD LIPPED TI 5.5X80 (Rod) IMPLANT
SCREW CANC PA 5.5X40 (Screw) IMPLANT
SCREW KODIAK 5.5X45 (Screw) IMPLANT
SET SCREW SPNE (Screw) IMPLANT
STRIP CLOSURE SKIN 1/2X4 (GAUZE/BANDAGES/DRESSINGS) ×1 IMPLANT
SUT VIC AB 0 CT1 18XCR BRD8 (SUTURE) ×1 IMPLANT
SUT VIC AB 2-0 CP2 18 (SUTURE) ×1 IMPLANT
SUT VIC AB 3-0 SH 8-18 (SUTURE) ×1 IMPLANT
SYR 30ML SLIP (SYRINGE) ×1 IMPLANT
TOWEL GREEN STERILE (TOWEL DISPOSABLE) ×1 IMPLANT
TOWEL GREEN STERILE FF (TOWEL DISPOSABLE) ×1 IMPLANT
WATER STERILE IRR 1000ML POUR (IV SOLUTION) ×1 IMPLANT

## 2024-01-03 NOTE — Progress Notes (Signed)
 Transition of Care [facility]) - CAGE-AID Screening   Patient Details  Name: Gabriela Allison MRN: [id number] Date of Birth: 10/09/2005   Asa Bjork, RN Trauma Response Nurse Phone Number: [phone] 01/03/2024, 10:39 AM      CAGE-AID Screening:    Have You Ever Felt You Ought to Cut Down on Your Drinking or Drug Use?: No Have People Annoyed You By Critizing Your Drinking Or Drug Use?: No Have You Felt Bad Or Guilty About Your Drinking Or Drug Use?: No Have You Ever Had a Drink or Used Drugs First Thing In The Morning to Steady Your Nerves or to Get Rid of a Hangover?: No CAGE-AID Score: 0  Substance Abuse Education Offered: (S) No (pt denies any etoh /abuse-- states she does drink socially and use marijuana)
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
## 2024-01-03 NOTE — Plan of Care (Signed)

## 2024-01-03 NOTE — Anesthesia Preprocedure Evaluation (Addendum)
 Anesthesia Evaluation  Patient identified by MRN, date of birth, ID band Patient awake    Reviewed: Allergy & Precautions, NPO status , Patient's Chart, lab work & pertinent test results  History of Anesthesia Complications Negative for: history of anesthetic complications  Airway Mallampati: I  TM Distance: >3 FB Neck ROM: Full    Dental  (+) Dental Advisory Given, Chipped   Pulmonary Current Smoker and Patient abstained from smoking.   Pulmonary exam normal        Cardiovascular negative cardio ROS Normal cardiovascular exam     Neuro/Psych  Headaches PSYCHIATRIC DISORDERS Anxiety Depression     Hx SI    GI/Hepatic negative GI ROS, Neg liver ROS,,,  Endo/Other  negative endocrine ROS    Renal/GU negative Renal ROS     Musculoskeletal negative musculoskeletal ROS (+)    Abdominal   Peds  Hematology negative hematology ROS (+)   Anesthesia Other Findings   Reproductive/Obstetrics                             Anesthesia Physical Anesthesia Plan  ASA: 2  Anesthesia Plan: General   Post-op Pain Management: Tylenol  PO (pre-op)* and Precedex   Induction: Intravenous  PONV Risk Score and Plan: 2 and Treatment may vary due to age or medical condition, Ondansetron , Dexamethasone and Midazolam  Airway Management Planned: Oral ETT  Additional Equipment: None  Intra-op Plan:   Post-operative Plan: Extubation in OR  Informed Consent: I have reviewed the patients History and Physical, chart, labs and discussed the procedure including the risks, benefits and alternatives for the proposed anesthesia with the patient or authorized representative who has indicated his/her understanding and acceptance.     Dental advisory given  Plan Discussed with: CRNA and Anesthesiologist  Anesthesia Plan Comments:        Anesthesia Quick Evaluation
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
## 2024-01-03 NOTE — Plan of Care (Signed)

## 2024-01-03 NOTE — Anesthesia Procedure Notes (Signed)
 Procedure Name: Intubation Date/Time: 01/03/2024 2:01 PM  Performed by: Samaa Ueda J, CRNAPre-anesthesia Checklist: Patient identified, Emergency Drugs available, Suction available and Patient being monitored Patient Re-evaluated:Patient Re-evaluated prior to induction Oxygen Delivery Method: Circle System Utilized Preoxygenation: Pre-oxygenation with 100% oxygen Induction Type: IV induction Ventilation: Mask ventilation without difficulty Laryngoscope Size: Miller and 3 Grade View: Grade I Tube type: Oral Tube size: 7.0 mm Number of attempts: 1 Airway Equipment and Method: Stylet and Oral airway Placement Confirmation: ETT inserted through vocal cords under direct vision, positive ETCO2 and breath sounds checked- equal and bilateral Secured at: 21 cm Tube secured with: Tape Dental Injury: Teeth and Oropharynx as per pre-operative assessment
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
## 2024-01-03 NOTE — Progress Notes (Signed)
  Transition of Care [facility]) Screening Note   Patient Details  Name: Gabriela Allison Date of Birth: 2006/03/09   Transition of Care [facility]) CM/SW Contact:    Valery Gaucher, LCSW Phone Number: 01/03/2024, 1:28 PM    Transition of Care Department [facility]) has reviewed patient and no TOC needs have been identified at this time. We will continue to monitor patient advancement through interdisciplinary progression rounds.  Liddie Reel, MSW, LCSW Clinical Social Worker
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
## 2024-01-03 NOTE — Transfer of Care (Signed)
 Immediate Anesthesia Transfer of Care Note  Patient: Gabriela Allison  Procedure(s) Performed: LUMBAR PERCUTANEOUS PEDICLE SCREW 3 LEVEL (Spine Lumbar)  Patient Location: PACU  Anesthesia Type:General  Level of Consciousness: awake, alert , and oriented  Airway & Oxygen Therapy: Patient Spontanous Breathing and Patient connected to nasal cannula oxygen  Post-op Assessment: Report given to RN and Post -op Vital signs reviewed and stable  Post vital signs: Reviewed and stable  Last Vitals:  Vitals Value Taken Time  BP 111/87 01/03/24 1606  Temp    Pulse 73 01/03/24 1615  Resp 21 01/03/24 1615  SpO2 95 % 01/03/24 1615  Vitals shown include unfiled device data.  Last Pain:  Vitals:   01/03/24 1322  TempSrc: (P) Oral  PainSc:       Patients Stated Pain Goal: 0 (01/03/24 1126)  Complications: No notable events documented.

## 2024-01-04 ENCOUNTER — Encounter (HOSPITAL_COMMUNITY): Admission: EM | Disposition: A | Discharge status: Home / Self Care | Payer: Self-pay | Source: Home / Self Care

## 2024-01-04 ENCOUNTER — Other Ambulatory Visit: Payer: Self-pay

## 2024-01-04 ENCOUNTER — Encounter (HOSPITAL_COMMUNITY): Payer: Self-pay

## 2024-01-04 ENCOUNTER — Inpatient Hospital Stay (HOSPITAL_COMMUNITY): Payer: Self-pay | Admitting: Anesthesiology

## 2024-01-04 ENCOUNTER — Inpatient Hospital Stay (HOSPITAL_COMMUNITY): Payer: Self-pay

## 2024-01-04 DIAGNOSIS — S82891A Other fracture of right lower leg, initial encounter for closed fracture: Secondary | ICD-10-CM

## 2024-01-04 HISTORY — PX: ORIF ANKLE FRACTURE: SHX5408

## 2024-01-04 LAB — URINALYSIS, ROUTINE W REFLEX MICROSCOPIC
Glucose, UA: NEGATIVE mg/dL
Hgb urine dipstick: NEGATIVE
Ketones, ur: 80 mg/dL — AB
Nitrite: NEGATIVE
Protein, ur: 30 mg/dL — AB
Specific Gravity, Urine: 1.033 — ABNORMAL HIGH (ref 1.005–1.030)
pH: 6 (ref 5.0–8.0)

## 2024-01-04 LAB — BASIC METABOLIC PANEL WITH GFR
Anion gap: 8 (ref 5–15)
BUN: 5 mg/dL — ABNORMAL LOW (ref 6–20)
CO2: 21 mmol/L — ABNORMAL LOW (ref 22–32)
Calcium: 8.3 mg/dL — ABNORMAL LOW (ref 8.9–10.3)
Chloride: 110 mmol/L (ref 98–111)
Creatinine, Ser: 0.58 mg/dL (ref 0.44–1.00)
GFR, Estimated: >60 mL/min (ref >60)
Glucose, Bld: 83 mg/dL (ref 70–99)
Potassium: 3.8 mmol/L (ref 3.5–5.1)
Sodium: 139 mmol/L (ref 135–145)

## 2024-01-04 LAB — CBC
HCT: 33.8 % — ABNORMAL LOW (ref 36.0–46.0)
Hemoglobin: 11.7 g/dL — ABNORMAL LOW (ref 12.0–15.0)
MCH: 32.3 pg (ref 26.0–34.0)
MCHC: 34.6 g/dL (ref 30.0–36.0)
MCV: 93.4 fL (ref 80.0–100.0)
Platelets: 133 10*3/uL — ABNORMAL LOW (ref 150–400)
RBC: 3.62 MIL/uL — ABNORMAL LOW (ref 3.87–5.11)
RDW: 12.5 % (ref 11.5–15.5)
WBC: 15.2 10*3/uL — ABNORMAL HIGH (ref 4.0–10.5)
nRBC: 0 % (ref 0.0–0.2)

## 2024-01-04 SURGERY — OPEN REDUCTION INTERNAL FIXATION (ORIF) ANKLE FRACTURE
Anesthesia: General | Site: Ankle | Laterality: Right

## 2024-01-04 MED ORDER — LACTATED RINGERS IV SOLN: INTRAVENOUS | Status: DC

## 2024-01-04 MED ORDER — ORAL CARE MOUTH RINSE
15.0000 mL | Freq: Once | OROMUCOSAL | Status: AC
  Administered 2024-01-04: 15 mL via OROMUCOSAL

## 2024-01-04 MED ORDER — CHLORHEXIDINE GLUCONATE CLOTH 2 % EX PADS
6.0000 | MEDICATED_PAD | Freq: Every day | CUTANEOUS | Status: DC
  Administered 2024-01-05 – 2024-01-06 (×2): 6 via TOPICAL

## 2024-01-04 MED ORDER — LIDOCAINE HCL (CARDIAC) PF 100 MG/5ML IV SOSY
PREFILLED_SYRINGE | INTRAVENOUS | Status: DC | PRN
  Administered 2024-01-04: 80 mg via INTRATRACHEAL

## 2024-01-04 MED ORDER — ONDANSETRON HCL 4 MG/2ML IJ SOLN
INTRAMUSCULAR | Status: DC | PRN
  Administered 2024-01-04: 4 mg via INTRAVENOUS

## 2024-01-04 MED ORDER — PHENYLEPHRINE 80 MCG/ML (10ML) SYRINGE FOR IV PUSH (FOR BLOOD PRESSURE SUPPORT)
PREFILLED_SYRINGE | INTRAVENOUS | Status: DC | PRN
  Administered 2024-01-04 (×2): 40 ug via INTRAVENOUS

## 2024-01-04 MED ORDER — CHLORHEXIDINE GLUCONATE 0.12 % MT SOLN: 15.0000 mL | Freq: Once | OROMUCOSAL | Status: AC

## 2024-01-04 MED ORDER — PROPOFOL 10 MG/ML IV BOLUS
INTRAVENOUS | Status: DC | PRN
  Administered 2024-01-04: 170 mg via INTRAVENOUS

## 2024-01-04 MED ORDER — FENTANYL CITRATE (PF) 100 MCG/2ML IJ SOLN
INTRAMUSCULAR | Status: AC
  Administered 2024-01-04: 50 ug
  Filled 2024-01-04: qty 2

## 2024-01-04 MED ORDER — DEXAMETHASONE SODIUM PHOSPHATE 10 MG/ML IJ SOLN
INTRAMUSCULAR | Status: DC | PRN
  Administered 2024-01-04: 10 mg via INTRAVENOUS

## 2024-01-04 MED ORDER — FENTANYL CITRATE PF 50 MCG/ML IJ SOSY: 50.0000 ug | PREFILLED_SYRINGE | Freq: Once | INTRAMUSCULAR | Status: DC

## 2024-01-04 MED ORDER — AMISULPRIDE (ANTIEMETIC) 5 MG/2ML IV SOLN: 10.0000 mg | Freq: Once | INTRAVENOUS | Status: DC | PRN

## 2024-01-04 MED ORDER — POLYETHYLENE GLYCOL 3350 17 G PO PACK: 17.0000 g | PACK | Freq: Every day | ORAL | Status: DC

## 2024-01-04 MED ORDER — MIDAZOLAM HCL 2 MG/2ML IJ SOLN: 2.0000 mg | Freq: Once | INTRAMUSCULAR | Status: AC

## 2024-01-04 MED ORDER — MIDAZOLAM HCL 2 MG/2ML IJ SOLN
INTRAMUSCULAR | Status: AC
  Administered 2024-01-04: 2 mg via INTRAVENOUS
  Filled 2024-01-04: qty 2

## 2024-01-04 MED ORDER — 0.9 % SODIUM CHLORIDE (POUR BTL) OPTIME
TOPICAL | Status: DC | PRN
  Administered 2024-01-04: 1000 mL

## 2024-01-04 MED ORDER — DEXMEDETOMIDINE HCL IN NACL 80 MCG/20ML IV SOLN
INTRAVENOUS | Status: DC | PRN
Start: 2024-01-04 — End: 2024-01-04
  Administered 2024-01-04: 4 ug via INTRAVENOUS
  Administered 2024-01-04: 8 ug via INTRAVENOUS
  Administered 2024-01-04: 4 ug via INTRAVENOUS

## 2024-01-04 MED ORDER — CEFAZOLIN SODIUM-DEXTROSE 1-4 GM/50ML-% IV SOLN
INTRAVENOUS | Status: DC | PRN
  Administered 2024-01-04: 2 g via INTRAVENOUS

## 2024-01-04 MED ORDER — ONDANSETRON HCL 4 MG/2ML IJ SOLN: 4.0000 mg | Freq: Once | INTRAMUSCULAR | Status: DC | PRN

## 2024-01-04 MED ORDER — CEFAZOLIN SODIUM-DEXTROSE 2-4 GM/100ML-% IV SOLN
2.0000 g | Freq: Four times a day (QID) | INTRAVENOUS | Status: AC
  Administered 2024-01-04 (×2): 2 g via INTRAVENOUS
  Filled 2024-01-04 (×2): qty 100

## 2024-01-04 MED ORDER — ALBUMIN HUMAN 5 % IV SOLN: INTRAVENOUS | Status: DC | PRN

## 2024-01-04 MED ORDER — FENTANYL CITRATE (PF) 100 MCG/2ML IJ SOLN: 25.0000 ug | INTRAMUSCULAR | Status: DC | PRN

## 2024-01-04 MED ORDER — METHOCARBAMOL 500 MG PO TABS
1000.0000 mg | ORAL_TABLET | Freq: Four times a day (QID) | ORAL | Status: DC
  Administered 2024-01-04 – 2024-01-07 (×12): 1000 mg via ORAL
  Filled 2024-01-04 (×12): qty 2

## 2024-01-04 SURGICAL SUPPLY — 57 items
ALCOHOL 70% 16 OZ (MISCELLANEOUS) ×1 IMPLANT
BAG COUNTER SPONGE SURGICOUNT (BAG) ×1 IMPLANT
BANDAGE ESMARK 6X9 LF (GAUZE/BANDAGES/DRESSINGS) IMPLANT
BIT DRILL 2 LNG CALIBR (DRILL) IMPLANT
BIT DRILL 2.5 CANN STRL (BIT) IMPLANT
BIT DRILL 2.6 CANN (BIT) IMPLANT
BLADE SURG 15 STRL LF DISP TIS (BLADE) ×1 IMPLANT
BNDG COHESIVE 4X5 TAN STRL LF (GAUZE/BANDAGES/DRESSINGS) IMPLANT
BNDG COHESIVE 6X5 TAN ST LF (GAUZE/BANDAGES/DRESSINGS) IMPLANT
BNDG ELASTIC 4INX 5YD STR LF (GAUZE/BANDAGES/DRESSINGS) IMPLANT
BNDG ELASTIC 6INX 5YD STR LF (GAUZE/BANDAGES/DRESSINGS) IMPLANT
BNDG ELASTIC 6X10 VLCR STRL LF (GAUZE/BANDAGES/DRESSINGS) ×1 IMPLANT
CANISTER SUCT 3000ML PPV (MISCELLANEOUS) ×1 IMPLANT
CHLORAPREP W/TINT 26 (MISCELLANEOUS) ×2 IMPLANT
COVER SURGICAL LIGHT HANDLE (MISCELLANEOUS) ×1 IMPLANT
CUFF TOURN SGL QUICK 42 (TOURNIQUET CUFF) IMPLANT
CUFF TRNQT CYL 34X4.125X (TOURNIQUET CUFF) ×1 IMPLANT
DRAPE C-ARM 42X72 X-RAY (DRAPES) IMPLANT
DRAPE C-ARMOR (DRAPES) IMPLANT
DRAPE OEC MINIVIEW 54X84 (DRAPES) ×1 IMPLANT
DRAPE U-SHAPE 47X51 STRL (DRAPES) ×1 IMPLANT
DRSG MEPITEL 4X7.2 (GAUZE/BANDAGES/DRESSINGS) ×1 IMPLANT
DRSG XEROFORM 1X8 (GAUZE/BANDAGES/DRESSINGS) ×1 IMPLANT
ELECTRODE REM PT RTRN 9FT ADLT (ELECTROSURGICAL) ×1 IMPLANT
GAUZE PAD ABD 8X10 STRL (GAUZE/BANDAGES/DRESSINGS) ×2 IMPLANT
GAUZE SPONGE 4X4 12PLY STRL (GAUZE/BANDAGES/DRESSINGS) IMPLANT
GAUZE SPONGE 4X4 12PLY STRL LF (GAUZE/BANDAGES/DRESSINGS) ×1 IMPLANT
GAUZE XEROFORM 1X8 LF (GAUZE/BANDAGES/DRESSINGS) IMPLANT
GLOVE BIOGEL M STRL SZ7.5 (GLOVE) ×1 IMPLANT
GLOVE BIOGEL PI IND STRL 8 (GLOVE) ×1 IMPLANT
GLOVE SRG 8 PF TXTR STRL LF DI (GLOVE) ×1 IMPLANT
GLOVE SURG ENC TEXT LTX SZ7.5 (GLOVE) ×1 IMPLANT
GOWN STRL REUS W/ TWL LRG LVL3 (GOWN DISPOSABLE) ×1 IMPLANT
GOWN STRL REUS W/ TWL XL LVL3 (GOWN DISPOSABLE) ×2 IMPLANT
GUIDEWIRE 1.35MM (WIRE) IMPLANT
KIT BASIN OR (CUSTOM PROCEDURE TRAY) ×1 IMPLANT
KIT TURNOVER KIT B (KITS) ×1 IMPLANT
NS IRRIG 1000ML POUR BTL (IV SOLUTION) ×1 IMPLANT
PACK ORTHO EXTREMITY (CUSTOM PROCEDURE TRAY) ×1 IMPLANT
PAD ARMBOARD POSITIONER FOAM (MISCELLANEOUS) ×2 IMPLANT
PAD CAST 4YDX4 CTTN HI CHSV (CAST SUPPLIES) ×1 IMPLANT
PADDING CAST ABS COTTON 4X4 ST (CAST SUPPLIES) IMPLANT
PLATE SHEER VERTICAL 4H (Plate) IMPLANT
SCREW LOCK COMP 3X16 (Screw) IMPLANT
SCREW LP TIT 3.5X30 (Screw) IMPLANT
SCREW LP TIT 3.5X32 (Screw) IMPLANT
SCREW QCFIX CANN 4X46 SHT (Screw) IMPLANT
SCREW VAL KREULOCK 3.0X14 TI (Screw) IMPLANT
SPLINT PLASTER CAST FAST 5X30 (CAST SUPPLIES) IMPLANT
SPONGE T-LAP 18X18 ~~LOC~~+RFID (SPONGE) ×1 IMPLANT
SUCTION TUBE FRAZIER 10FR DISP (SUCTIONS) ×1 IMPLANT
SUT ETHILON 3 0 PS 1 (SUTURE) ×1 IMPLANT
SUT MNCRL AB 3-0 PS2 27 (SUTURE) ×1 IMPLANT
SUT VIC AB 2-0 CT1 TAPERPNT 27 (SUTURE) ×2 IMPLANT
TOWEL GREEN STERILE (TOWEL DISPOSABLE) ×1 IMPLANT
TOWEL GREEN STERILE FF (TOWEL DISPOSABLE) ×1 IMPLANT
TUBE CONNECTING 12X1/4 (SUCTIONS) ×1 IMPLANT

## 2024-01-04 NOTE — Anesthesia Procedure Notes (Signed)
 Procedure Name: LMA Insertion Date/Time: 01/04/2024 2:12 PM  Performed by: Emmitt Harp, CRNAPre-anesthesia Checklist: Patient identified, Emergency Drugs available, Suction available and Patient being monitored Patient Re-evaluated:Patient Re-evaluated prior to induction Oxygen Delivery Method: Circle System Utilized Preoxygenation: Pre-oxygenation with 100% oxygen Induction Type: IV induction Ventilation: Mask ventilation without difficulty LMA: LMA inserted LMA Size: 4.0 Number of attempts: 1 Airway Equipment and Method: Bite block Placement Confirmation: positive ETCO2 Tube secured with: Tape Dental Injury: Teeth and Oropharynx as per pre-operative assessment
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
## 2024-01-04 NOTE — Progress Notes (Signed)
 Called for report, no answer. Transport sent for patient

## 2024-01-04 NOTE — Anesthesia Preprocedure Evaluation (Signed)
 Anesthesia Evaluation  Patient identified by MRN, date of birth, ID band Patient awake    Reviewed: Allergy & Precautions, NPO status , Patient's Chart, lab work & pertinent test results  Airway Mallampati: II  TM Distance: >3 FB Neck ROM: Full    Dental  (+) Teeth Intact, Dental Advisory Given   Pulmonary neg pulmonary ROS, Patient abstained from smoking.   Pulmonary exam normal breath sounds clear to auscultation       Cardiovascular negative cardio ROS Normal cardiovascular exam Rhythm:Regular Rate:Normal     Neuro/Psych  Headaches PSYCHIATRIC DISORDERS Anxiety Depression       GI/Hepatic negative GI ROS, Neg liver ROS,,,  Endo/Other  negative endocrine ROS    Renal/GU negative Renal ROS     Musculoskeletal RIGHT ANKLE FRACTURE   Abdominal   Peds  Hematology  (+) Blood dyscrasia (Plt 133k), anemia   Anesthesia Other Findings Day of surgery medications reviewed with the patient.  Reproductive/Obstetrics                             Anesthesia Physical Anesthesia Plan  ASA: 2  Anesthesia Plan: General   Post-op Pain Management: Regional block*, Tylenol  PO (pre-op)*, Gabapentin PO (pre-op)* and Celebrex PO (pre-op)*   Induction: Intravenous  PONV Risk Score and Plan: 3 and Midazolam, Dexamethasone and Ondansetron   Airway Management Planned: LMA  Additional Equipment:   Intra-op Plan:   Post-operative Plan: Extubation in OR  Informed Consent: I have reviewed the patients History and Physical, chart, labs and discussed the procedure including the risks, benefits and alternatives for the proposed anesthesia with the patient or authorized representative who has indicated his/her understanding and acceptance.     Dental advisory given  Plan Discussed with: CRNA  Anesthesia Plan Comments:        Anesthesia Quick Evaluation

## 2024-01-04 NOTE — Transfer of Care (Signed)
 Immediate Anesthesia Transfer of Care Note  Patient: Gabriela Allison  Procedure(s) Performed: OPEN REDUCTION INTERNAL FIXATION (ORIF) RIGHT ANKLE FRACTURE (Right: Ankle)  Patient Location: PACU  Anesthesia Type:General  Level of Consciousness: awake, drowsy, and patient cooperative  Airway & Oxygen Therapy: Patient Spontanous Breathing  Post-op Assessment: Report given to RN and Post -op Vital signs reviewed and stable  Post vital signs: Reviewed and stable  Last Vitals:  Vitals Value Taken Time  BP 101/51 01/04/24 1520  Temp    Pulse 65 01/04/24 1521  Resp 17 01/04/24 1521  SpO2 99 % 01/04/24 1521  Vitals shown include unfiled device data.  Last Pain:  Vitals:   01/04/24 1211  TempSrc:   PainSc: 7       Patients Stated Pain Goal: 0 (01/03/24 1748)  Complications: No notable events documented.

## 2024-01-04 NOTE — Progress Notes (Signed)
 OT Cancellation Note  Patient Details Name: Gabriela Allison MRN: [id number] DOB: Feb 16, 2006   Cancelled Treatment:    Reason Eval/Treat Not Completed: Patient at procedure or test/ unavailable.  Pt going to OR today for R ankle ORIF. Will hold and await new orders post op.     Raevin Wierenga D Jennessa Trigo 01/04/2024, 12:03 PM 01/04/2024  RP, OTR/L  Acute Rehabilitation Services  Office:  [phone]

## 2024-01-04 NOTE — Anesthesia Postprocedure Evaluation (Signed)
 Anesthesia Post Note  Patient: Gabriela Allison  Procedure(s) Performed: LUMBAR PERCUTANEOUS PEDICLE SCREW 3 LEVEL (Spine Lumbar)     Patient location during evaluation: PACU Anesthesia Type: General Level of consciousness: awake and alert Pain management: pain level controlled Vital Signs Assessment: post-procedure vital signs reviewed and stable Respiratory status: spontaneous breathing, nonlabored ventilation and respiratory function stable Cardiovascular status: stable and blood pressure returned to baseline Anesthetic complications: no   No notable events documented.  Last Vitals:  Vitals:   01/04/24 0255 01/04/24 0536  BP: (!) 121/57 116/65  Pulse: 67 (!) 57  Resp: 17 17  Temp: 37.5 C 36.7 C  SpO2: 99% 100%       Juventino Oppenheim

## 2024-01-04 NOTE — Progress Notes (Signed)
 PT Cancellation Note  Patient Details Name: Gabriela Allison MRN: [id number] DOB: 2006/05/31   Cancelled Treatment:    Reason Eval/Treat Not Completed: Other (comment). Pt going to OR today for R ankle ORIF. PT to wait until post surgery to complete PT eval.   Renaee Caro, PT, DPT Acute Rehabilitation Services Secure chat preferred Office #: [phone]    Jenna Moan 01/04/2024, 10:57 AM

## 2024-01-05 ENCOUNTER — Encounter (HOSPITAL_COMMUNITY): Payer: Self-pay | Admitting: Orthopaedic Surgery

## 2024-01-05 LAB — CBC
HCT: 32.5 % — ABNORMAL LOW (ref 36.0–46.0)
Hemoglobin: 11.1 g/dL — ABNORMAL LOW (ref 12.0–15.0)
MCH: 32.6 pg (ref 26.0–34.0)
MCHC: 34.2 g/dL (ref 30.0–36.0)
MCV: 95.6 fL (ref 80.0–100.0)
Platelets: 117 10*3/uL — ABNORMAL LOW (ref 150–400)
RBC: 3.4 MIL/uL — ABNORMAL LOW (ref 3.87–5.11)
RDW: 12.4 % (ref 11.5–15.5)
WBC: 11.1 10*3/uL — ABNORMAL HIGH (ref 4.0–10.5)
nRBC: 0 % (ref 0.0–0.2)

## 2024-01-05 LAB — NASOPHARYNGEAL CULTURE: Culture: NORMAL

## 2024-01-05 LAB — BASIC METABOLIC PANEL WITH GFR
Anion gap: 8 (ref 5–15)
BUN: 6 mg/dL (ref 6–20)
CO2: 21 mmol/L — ABNORMAL LOW (ref 22–32)
Calcium: 8.1 mg/dL — ABNORMAL LOW (ref 8.9–10.3)
Chloride: 110 mmol/L (ref 98–111)
Creatinine, Ser: 0.57 mg/dL (ref 0.44–1.00)
GFR, Estimated: >60 mL/min (ref >60)
Glucose, Bld: 79 mg/dL (ref 70–99)
Potassium: 3.8 mmol/L (ref 3.5–5.1)
Sodium: 139 mmol/L (ref 135–145)

## 2024-01-05 MED ORDER — ACETAMINOPHEN 500 MG PO TABS
1000.0000 mg | ORAL_TABLET | Freq: Four times a day (QID) | ORAL | Status: DC
  Administered 2024-01-05 – 2024-01-07 (×8): 1000 mg via ORAL
  Filled 2024-01-05 (×9): qty 2

## 2024-01-05 MED ORDER — POLYETHYLENE GLYCOL 3350 17 G PO PACK
17.0000 g | PACK | Freq: Two times a day (BID) | ORAL | Status: DC
  Administered 2024-01-05 – 2024-01-07 (×4): 17 g via ORAL
  Filled 2024-01-05 (×5): qty 1

## 2024-01-05 MED ORDER — LIDOCAINE 5 % EX PTCH
2.0000 | MEDICATED_PATCH | CUTANEOUS | Status: DC
  Administered 2024-01-05 – 2024-01-07 (×3): 2 via TRANSDERMAL
  Filled 2024-01-05 (×3): qty 2

## 2024-01-05 NOTE — Anesthesia Postprocedure Evaluation (Signed)
 Anesthesia Post Note  Patient: Gabriela Allison  Procedure(s) Performed: OPEN REDUCTION INTERNAL FIXATION (ORIF) RIGHT ANKLE FRACTURE (Right: Ankle)     Patient location during evaluation: PACU Anesthesia Type: General Level of consciousness: awake and alert Pain management: pain level controlled Vital Signs Assessment: post-procedure vital signs reviewed and stable Respiratory status: spontaneous breathing, nonlabored ventilation and respiratory function stable Cardiovascular status: blood pressure returned to baseline and stable Postop Assessment: no apparent nausea or vomiting Anesthetic complications: no   No notable events documented.  Last Vitals:    Last Pain:                 Erin Havers

## 2024-01-05 NOTE — Progress Notes (Signed)
 Patient ID: Gabriela Allison, female   DOB: November 05, 2005, 18 y.o.   MRN: [id number] She looks good today.  She complains of appropriate postoperative back soreness.  Ankle was fixed.  Ortho says touchdown weightbearing.  May mobilize with PT and OT from our standpoint.  Consider Foley catheter removal and advancing diet

## 2024-01-05 NOTE — Plan of Care (Signed)
 ?  Problem: Clinical Measurements: ?Goal: Will remain free from infection ?Outcome: Progressing ?  ?

## 2024-01-05 NOTE — Plan of Care (Signed)
   Problem: Activity: Goal: Risk for activity intolerance will decrease Outcome: Progressing   Problem: Coping: Goal: Level of anxiety will decrease Outcome: Progressing   Problem: Pain Managment: Goal: General experience of comfort will improve and/or be controlled Outcome: Progressing

## 2024-01-05 NOTE — Plan of Care (Signed)
   Problem: Clinical Measurements: Goal: Diagnostic test results will improve Outcome: Progressing
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
## 2024-01-06 MED ORDER — HYDROMORPHONE HCL 1 MG/ML IJ SOLN: 0.5000 mg | INTRAMUSCULAR | Status: DC | PRN

## 2024-01-06 MED ORDER — GABAPENTIN 400 MG PO CAPS
400.0000 mg | ORAL_CAPSULE | Freq: Three times a day (TID) | ORAL | Status: DC
  Administered 2024-01-06 – 2024-01-07 (×3): 400 mg via ORAL
  Filled 2024-01-06 (×3): qty 1

## 2024-01-06 MED ORDER — OXYCODONE HCL 5 MG PO TABS
10.0000 mg | ORAL_TABLET | ORAL | Status: DC | PRN
  Administered 2024-01-06 – 2024-01-07 (×4): 15 mg via ORAL
  Filled 2024-01-06 (×4): qty 3

## 2024-01-06 NOTE — Progress Notes (Signed)
 Subjective: Patient reports unsure of pain because she has not moved in several hours. She is very tearful this morning   Objective: Vital signs in last 24 hours: Temp:  [97.7 F (36.5 C)-98.4 F (36.9 C)] 97.7 F (36.5 C) (05/01 0800) Pulse Rate:  [51-93] 63 (05/01 0314) Resp:  [16-20] 16 (05/01 0800) BP: (94-119)/(46-65) 100/57 (05/01 0800) SpO2:  [99 %-100 %] 100 % (05/01 0800)  Intake/Output from previous day: No intake/output data recorded. Intake/Output this shift: No intake/output data recorded.  Neurologic: Grossly normal  Lab Results: Lab Results  Component Value Date   WBC 11.1 (H) 01/05/2024   HGB 11.1 (L) 01/05/2024   HCT 32.5 (L) 01/05/2024   MCV 95.6 01/05/2024   PLT 117 (L) 01/05/2024   Lab Results  Component Value Date   INR 1.0 01/02/2024   BMET Lab Results  Component Value Date   NA 139 01/05/2024   K 3.8 01/05/2024   CL 110 01/05/2024   CO2 21 (L) 01/05/2024   GLUCOSE 79 01/05/2024   BUN 6 01/05/2024   CREATININE 0.57 01/05/2024   CALCIUM 8.1 (L) 01/05/2024    Studies/Results: DG Ankle Complete Right Result Date: 01/04/2024 CLINICAL DATA:  Elective surgery. EXAM: RIGHT ANKLE - COMPLETE 3+ VIEW COMPARISON:  Preoperative imaging FINDINGS: Two fluoroscopic spot views of the right ankle submitted from the operating room. Medial plate and screw fixation of distal tibial fracture. Single screw traverses the distal fibular fracture. Fluoroscopy time 25 seconds. Dose 0.84 mGy. IMPRESSION: Intraoperative fluoroscopy during distal tibial and fibular fracture fixation. Electronically Signed   By: Chadwick Colonel M.D.   On: 01/04/2024 16:42   DG C-Arm 1-60 Min-No Report Result Date: 01/04/2024 Fluoroscopy was utilized by the requesting physician.  No radiographic interpretation.    Assessment/Plan: S/p lumbar fusion for fracture. Continue to mobilize and work on pain control.    LOS: 4 days    Kenard Paul [facility] 01/06/2024, 8:13 AM
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
## 2024-01-07 ENCOUNTER — Other Ambulatory Visit (HOSPITAL_COMMUNITY): Payer: Self-pay

## 2024-01-07 MED ORDER — DOCUSATE SODIUM 100 MG PO CAPS
100.0000 mg | ORAL_CAPSULE | Freq: Two times a day (BID) | ORAL | 0 refills | Status: DC
  Filled 2024-01-07: qty 10, 5d supply, fill #0

## 2024-01-07 MED ORDER — POLYETHYLENE GLYCOL 3350 17 GM/SCOOP PO POWD
17.0000 g | Freq: Two times a day (BID) | ORAL | 0 refills | Status: DC
  Filled 2024-01-07: qty 238, 7d supply, fill #0

## 2024-01-07 MED ORDER — GABAPENTIN 400 MG PO CAPS
400.0000 mg | ORAL_CAPSULE | Freq: Three times a day (TID) | ORAL | 0 refills | Status: DC
  Filled 2024-01-07: qty 90, 30d supply, fill #0

## 2024-01-07 MED ORDER — BACITRACIN ZINC 500 UNIT/GM EX OINT: TOPICAL_OINTMENT | Freq: Every day | CUTANEOUS | Status: DC

## 2024-01-07 MED ORDER — OXYCODONE HCL 10 MG PO TABS
10.0000 mg | ORAL_TABLET | ORAL | 0 refills | Status: DC | PRN
Start: 2024-01-07 — End: 2024-03-14
  Filled 2024-01-07: qty 40, 5d supply, fill #0

## 2024-01-07 MED ORDER — CELECOXIB 200 MG PO CAPS
200.0000 mg | ORAL_CAPSULE | Freq: Two times a day (BID) | ORAL | 0 refills | Status: DC
  Filled 2024-01-07: qty 60, 30d supply, fill #0

## 2024-01-07 MED ORDER — ACETAMINOPHEN 500 MG PO TABS: 1000.0000 mg | ORAL_TABLET | Freq: Four times a day (QID) | ORAL | Status: AC | PRN

## 2024-01-07 MED ORDER — LIDOCAINE 5 % EX PTCH
2.0000 | MEDICATED_PATCH | CUTANEOUS | 0 refills | Status: DC
Start: 2024-01-07 — End: 2024-03-14
  Filled 2024-01-07: qty 60, 30d supply, fill #0

## 2024-01-07 MED ORDER — METHOCARBAMOL 500 MG PO TABS
1000.0000 mg | ORAL_TABLET | Freq: Four times a day (QID) | ORAL | 0 refills | Status: DC | PRN
  Filled 2024-01-07: qty 60, 8d supply, fill #0
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
## 2024-01-07 NOTE — Plan of Care (Signed)

## 2024-01-07 NOTE — Progress Notes (Signed)
 Subjective: Patient reports doing well, pain is better today. She did ambulate the hallway yesterday   Objective: Vital signs in last 24 hours: Temp:  [98.1 F (36.7 C)-98.3 F (36.8 C)] 98.2 F (36.8 C) (05/02 0730) Pulse Rate:  [55-90] 55 (05/02 0730) Resp:  [17-18] 17 (05/02 0424) BP: (97-134)/(55-88) 97/56 (05/02 0730) SpO2:  [99 %-100 %] 99 % (05/02 0730)  Intake/Output from previous day: 05/01 0701 - 05/02 0700 In: 118 [P.O.:118] Out: -  Intake/Output this shift: Total I/O In: 3 [I.V.:3] Out: -   Neurologic: Grossly normal  Lab Results: Lab Results  Component Value Date   WBC 11.1 (H) 01/05/2024   HGB 11.1 (L) 01/05/2024   HCT 32.5 (L) 01/05/2024   MCV 95.6 01/05/2024   PLT 117 (L) 01/05/2024   Lab Results  Component Value Date   INR 1.0 01/02/2024   BMET Lab Results  Component Value Date   NA 139 01/05/2024   K 3.8 01/05/2024   CL 110 01/05/2024   CO2 21 (L) 01/05/2024   GLUCOSE 79 01/05/2024   BUN 6 01/05/2024   CREATININE 0.57 01/05/2024   CALCIUM 8.1 (L) 01/05/2024    Studies/Results: No results found.  Assessment/Plan: Postop day 4 T12-L2 posterior fixation for L1 fracture. She is doing very well from our standpoint. Ambulating well. Continue therapy    LOS: 5 days    Kenard Paul [facility] 01/07/2024, 12:17 PM

## 2024-01-07 NOTE — Progress Notes (Signed)
 Patient ID: Gabriela Allison, female   DOB: July 17, 2006, 18 y.o.   MRN: [id number] Incision clean dry and intact.  She looks good.  She has good strength.  Right distal leg is in a cast.  Walked 130 feet yesterday.  She can follow-up with us  in 2 weeks in the office
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
## 2024-01-07 NOTE — Plan of Care (Signed)
  Problem: Education: Goal: Knowledge of General Education information will improve Description: Including pain rating scale, medication(s)/side effects and non-pharmacologic comfort measures Outcome: Adequate for Discharge   Problem: Health Behavior/Discharge Planning: Goal: Ability to manage health-related needs will improve Outcome: Adequate for Discharge   Problem: Clinical Measurements: Goal: Ability to maintain clinical measurements within normal limits will improve Outcome: Adequate for Discharge Goal: Will remain free from infection Outcome: Adequate for Discharge Goal: Diagnostic test results will improve Outcome: Adequate for Discharge Goal: Respiratory complications will improve Outcome: Adequate for Discharge Goal: Cardiovascular complication will be avoided Outcome: Adequate for Discharge   Problem: Activity: Goal: Risk for activity intolerance will decrease Outcome: Adequate for Discharge   Problem: Nutrition: Goal: Adequate nutrition will be maintained Outcome: Adequate for Discharge   Problem: Coping: Goal: Level of anxiety will decrease Outcome: Adequate for Discharge   Problem: Elimination: Goal: Will not experience complications related to bowel motility Outcome: Adequate for Discharge Goal: Will not experience complications related to urinary retention Outcome: Adequate for Discharge   Problem: Pain Managment: Goal: General experience of comfort will improve and/or be controlled Outcome: Adequate for Discharge   Problem: Safety: Goal: Ability to remain free from injury will improve Outcome: Adequate for Discharge   Problem: Skin Integrity: Goal: Risk for impaired skin integrity will decrease Outcome: Adequate for Discharge   Problem: Education: Goal: Ability to verbalize activity precautions or restrictions will improve Outcome: Adequate for Discharge Goal: Knowledge of the prescribed therapeutic regimen will improve Outcome: Adequate for  Discharge Goal: Understanding of discharge needs will improve Outcome: Adequate for Discharge   Problem: Activity: Goal: Ability to avoid complications of mobility impairment will improve Outcome: Adequate for Discharge Goal: Ability to tolerate increased activity will improve Outcome: Adequate for Discharge Goal: Will remain free from falls Outcome: Adequate for Discharge   Problem: Bowel/Gastric: Goal: Gastrointestinal status for postoperative course will improve Outcome: Adequate for Discharge   Problem: Clinical Measurements: Goal: Ability to maintain clinical measurements within normal limits will improve Outcome: Adequate for Discharge Goal: Postoperative complications will be avoided or minimized Outcome: Adequate for Discharge Goal: Diagnostic test results will improve Outcome: Adequate for Discharge   Problem: Pain Management: Goal: Pain level will decrease Outcome: Adequate for Discharge   Problem: Skin Integrity: Goal: Will show signs of wound healing Outcome: Adequate for Discharge   Problem: Health Behavior/Discharge Planning: Goal: Identification of resources available to assist in meeting health care needs will improve Outcome: Adequate for Discharge   Problem: Bladder/Genitourinary: Goal: Urinary functional status for postoperative course will improve Outcome: Adequate for Discharge   Problem: Acute Rehab PT Goals(only PT should resolve) Goal: Pt Will Go Supine/Side To Sit Outcome: Adequate for Discharge Goal: Patient Will Transfer Sit To/From Stand Outcome: Adequate for Discharge Goal: Pt Will Ambulate Outcome: Adequate for Discharge Goal: Pt Will Go Up/Down Stairs Outcome: Adequate for Discharge   Problem: Acute Rehab OT Goals (only OT should resolve) Goal: Pt. Will Perform Grooming Outcome: Adequate for Discharge Goal: Pt. Will Perform Lower Body Dressing Outcome: Adequate for Discharge Goal: Pt. Will Transfer To Toilet Outcome: Adequate for  Discharge Goal: Pt. Will Perform Toileting-Clothing Manipulation Outcome: Adequate for Discharge Goal: Pt. Will Perform Tub/Shower Transfer Outcome: Adequate for Discharge
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
## 2024-01-26 ENCOUNTER — Telehealth: Payer: Self-pay | Admitting: Family Medicine

## 2024-03-14 ENCOUNTER — Encounter: Payer: Self-pay | Admitting: Adult Health

## 2024-03-14 ENCOUNTER — Ambulatory Visit (INDEPENDENT_AMBULATORY_CARE_PROVIDER_SITE_OTHER): Payer: MEDICAID | Admitting: Adult Health

## 2024-03-14 ENCOUNTER — Other Ambulatory Visit (HOSPITAL_COMMUNITY)
Admission: RE | Admit: 2024-03-14 | Discharge: 2024-03-14 | Disposition: A | Discharge status: Home / Self Care | Payer: MEDICAID | Source: Ambulatory Visit | Attending: Adult Health | Admitting: Adult Health

## 2024-03-14 VITALS — BP 133/75 | HR 83 | Ht 65.0 in | Wt 155.0 lb

## 2024-03-14 DIAGNOSIS — N898 Other specified noninflammatory disorders of vagina: Secondary | ICD-10-CM | POA: Insufficient documentation

## 2024-03-14 DIAGNOSIS — Z113 Encounter for screening for infections with a predominantly sexual mode of transmission: Secondary | ICD-10-CM | POA: Insufficient documentation

## 2024-03-14 DIAGNOSIS — Z3046 Encounter for surveillance of implantable subdermal contraceptive: Secondary | ICD-10-CM | POA: Diagnosis not present

## 2024-03-14 DIAGNOSIS — Z3202 Encounter for pregnancy test, result negative: Secondary | ICD-10-CM

## 2024-03-14 LAB — POCT URINE PREGNANCY: Preg Test, Ur: NEGATIVE

## 2024-03-14 MED ORDER — ETONOGESTREL 68 MG ~~LOC~~ IMPL
68.0000 mg | DRUG_IMPLANT | Freq: Once | SUBCUTANEOUS | Status: AC
  Administered 2024-03-14: 68 mg via SUBCUTANEOUS

## 2024-03-14 NOTE — Progress Notes (Signed)
  Subjective:     Patient ID: Gabriela Allison, female   DOB: December 30, 2005, 18 y.o.   MRN: [id number]  HPI Gabriela Allison is a 18 year old white female,single, G0P0, in for nexplanon  removal and reinsertion. Has sex Saturday. Last nexplanon  inserted 03/19/21. She requests STD screening, for vaginal irritation.  PCP is KANDICE Kins FNP  Review of Systems For nexplanon  removal and reinsertion Has had vaginal irritation  Reviewed past medical,surgical, social and family history. Reviewed medications and allergies.     Objective:   Physical Exam BP 133/75 (BP Location: Right Arm, Patient Position: Sitting, Cuff Size: Normal)   Pulse 83   Ht 5' 5 (1.651 m)   Wt 155 lb (70.3 kg)   LMP 02/13/2024 (Approximate)   BMI 25.79 kg/m  UPT is negative. Consent signed and time out called.   Left arm cleansed with betadine , and injected with 1.5 cc 2% lidocaine  and waited til numb.Under sterile technique a #11 blade was used to make small vertical incision, and a curved forceps was used to easily remove rod.New rod placed and palpated by provider and pt.  Steri strips applied. Pressure dressing applied.  She collected self swab.   [facility] - 03/14/24 1437       Pregnancy Intention Screening   Does the patient want to become pregnant in the next year? No    Does the patient's partner want to become pregnant in the next year? No    Would the patient like to discuss contraceptive options today? No      Contraception Wrap Up   Current Method Hormonal Implant    End Method Hormonal Implant    Contraception Counseling Provided Yes          Assessment:     1. Pregnancy examination or test, negative result - POCT urine pregnancy  2. Screening examination for STD (sexually transmitted disease) CV swab sent for GC/CHL,trich,BV and yeast  - Cervicovaginal ancillary only( [place])  3. Vaginal irritation CV swab sent  - Cervicovaginal ancillary only( )  4. Encounter for removal and  reinsertion of Nexplanon  (Primary) Nexplanon  removed and replaced left arm  Exp 2027-06 Lot [id number] - etonogestrel  (NEXPLANON ) implant 68 mg    Remove in 3 years Use condoms x 2 weeks, keep clean and dry x 24 hours, no heavy lifting, keep steri strips on x 72 hours, Keep pressure dressing on x 24 hours. Follow up prn problems.  Plan:     Remove nexplanon  in 3 years

## 2024-03-14 NOTE — Patient Instructions (Signed)
Use condoms x 2 weeks, keep clean and dry x 24 hours, no heavy lifting, keep steri strips on x 72 hours, Keep pressure dressing on x 24 hours. Follow up prn problems.  

## 2024-03-15 ENCOUNTER — Telehealth: Payer: Self-pay

## 2024-03-15 NOTE — Telephone Encounter (Signed)
 LVM to call and schedule new patient appt with Chelsa

## 2024-03-15 NOTE — Telephone Encounter (Signed)
 Copied from CRM [id number]. Topic: Appointments - Transfer of Care >> Mar 15, 2024  7:38 AM Carlatta H wrote: Pt is requesting to transfer FROM: Gabriela Allison Pt is requesting to transfer TO: [name] Reason for requested transfer: Trasnfering to different office It is the responsibility of the team the patient would like to transfer to (Dr. [name]) to reach out to the patient if for any reason this transfer is not acceptable.   ----------------------------------------------------------------------- From previous Reason for Contact - Scheduling: Patient/patient representative is calling to schedule an appointment. Refer to attachments for appointment information.

## 2024-03-16 ENCOUNTER — Ambulatory Visit: Payer: Self-pay | Admitting: Nurse Practitioner

## 2024-03-16 LAB — CERVICOVAGINAL ANCILLARY ONLY
Bacterial Vaginitis (gardnerella): POSITIVE — AB
Candida Glabrata: NEGATIVE
Candida Vaginitis: NEGATIVE
Chlamydia: NEGATIVE
Comment: NEGATIVE
Comment: NEGATIVE
Comment: NEGATIVE
Comment: NEGATIVE
Comment: NEGATIVE
Comment: NORMAL
Neisseria Gonorrhea: NEGATIVE
Trichomonas: POSITIVE — AB

## 2024-03-17 ENCOUNTER — Ambulatory Visit: Payer: Self-pay | Admitting: Adult Health

## 2024-03-17 DIAGNOSIS — A599 Trichomoniasis, unspecified: Secondary | ICD-10-CM | POA: Insufficient documentation

## 2024-03-17 MED ORDER — METRONIDAZOLE 500 MG PO TABS
500.0000 mg | ORAL_TABLET | Freq: Two times a day (BID) | ORAL | 0 refills | Status: AC
Start: 2024-03-17 — End: ?

## 2024-04-03 ENCOUNTER — Other Ambulatory Visit (HOSPITAL_COMMUNITY)
Admission: RE | Admit: 2024-04-03 | Discharge: 2024-04-03 | Disposition: A | Discharge status: Home / Self Care | Payer: MEDICAID | Source: Ambulatory Visit | Attending: Obstetrics & Gynecology | Admitting: Obstetrics & Gynecology

## 2024-04-03 ENCOUNTER — Ambulatory Visit (INDEPENDENT_AMBULATORY_CARE_PROVIDER_SITE_OTHER): Payer: MEDICAID | Admitting: *Deleted

## 2024-04-03 DIAGNOSIS — Z8619 Personal history of other infectious and parasitic diseases: Secondary | ICD-10-CM

## 2024-04-03 DIAGNOSIS — Z113 Encounter for screening for infections with a predominantly sexual mode of transmission: Secondary | ICD-10-CM

## 2024-04-03 DIAGNOSIS — Z09 Encounter for follow-up examination after completed treatment for conditions other than malignant neoplasm: Secondary | ICD-10-CM

## 2024-04-03 NOTE — Progress Notes (Signed)
   NURSE VISIT- VAGINITIS/STD/POC  SUBJECTIVE:  Gabriela Allison is a 18 y.o. G0P0000 GYN patientfemale here for a vaginal swab for proof of cure after treatment for Trichomonas.  She reports the following symptoms: none. Denies abnormal vaginal bleeding, significant pelvic pain, fever, or UTI symptoms.  OBJECTIVE:  LMP 02/13/2024 (Approximate)   Appears well, in no apparent distress  ASSESSMENT: Vaginal swab for proof of cure after treatment for trich.  PLAN: Self-collected vaginal probe for Gonorrhea, Chlamydia, Trichomonas, Bacterial Vaginosis, Yeast sent to lab Treatment: to be determined once results are received Follow-up as needed if symptoms persist/worsen, or new symptoms develop  Clarita Salt  04/03/2024 2:37 PM

## 2024-04-05 LAB — CERVICOVAGINAL ANCILLARY ONLY
Bacterial Vaginitis (gardnerella): POSITIVE — AB
Candida Glabrata: NEGATIVE
Candida Vaginitis: NEGATIVE
Chlamydia: NEGATIVE
Comment: NEGATIVE
Comment: NEGATIVE
Comment: NEGATIVE
Comment: NEGATIVE
Comment: NEGATIVE
Comment: NORMAL
Neisseria Gonorrhea: NEGATIVE
Trichomonas: NEGATIVE

## 2024-04-06 ENCOUNTER — Ambulatory Visit: Payer: Self-pay | Admitting: Obstetrics and Gynecology

## 2024-04-06 MED ORDER — METRONIDAZOLE 0.75 % VA GEL
1.0000 | Freq: Every day | VAGINAL | 1 refills | Status: AC
Start: 2024-04-06 — End: ?
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
## 2024-04-12 ENCOUNTER — Ambulatory Visit: Payer: MEDICAID | Attending: Orthopaedic Surgery | Admitting: Physical Therapy

## 2024-04-12 DIAGNOSIS — R262 Difficulty in walking, not elsewhere classified: Secondary | ICD-10-CM | POA: Insufficient documentation

## 2024-04-12 DIAGNOSIS — M25671 Stiffness of right ankle, not elsewhere classified: Secondary | ICD-10-CM | POA: Insufficient documentation

## 2024-04-19 ENCOUNTER — Ambulatory Visit: Payer: MEDICAID

## 2024-04-19 ENCOUNTER — Other Ambulatory Visit: Payer: Self-pay

## 2024-04-19 DIAGNOSIS — R262 Difficulty in walking, not elsewhere classified: Secondary | ICD-10-CM

## 2024-04-19 DIAGNOSIS — M25671 Stiffness of right ankle, not elsewhere classified: Secondary | ICD-10-CM
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
## 2024-05-03 ENCOUNTER — Ambulatory Visit: Payer: MEDICAID

## 2024-05-03 DIAGNOSIS — R262 Difficulty in walking, not elsewhere classified: Secondary | ICD-10-CM

## 2024-05-03 DIAGNOSIS — M25671 Stiffness of right ankle, not elsewhere classified: Secondary | ICD-10-CM
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
## 2024-05-05 ENCOUNTER — Ambulatory Visit: Payer: MEDICAID

## 2024-05-05 ENCOUNTER — Ambulatory Visit (HOSPITAL_COMMUNITY): Payer: MEDICAID

## 2024-05-05 DIAGNOSIS — R262 Difficulty in walking, not elsewhere classified: Secondary | ICD-10-CM | POA: Diagnosis not present

## 2024-05-05 DIAGNOSIS — M25671 Stiffness of right ankle, not elsewhere classified: Secondary | ICD-10-CM
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
## 2024-05-09 ENCOUNTER — Ambulatory Visit: Payer: MEDICAID | Attending: Orthopaedic Surgery

## 2024-05-09 DIAGNOSIS — R262 Difficulty in walking, not elsewhere classified: Secondary | ICD-10-CM | POA: Insufficient documentation

## 2024-05-09 DIAGNOSIS — M25671 Stiffness of right ankle, not elsewhere classified: Secondary | ICD-10-CM | POA: Insufficient documentation
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
## 2024-05-11 ENCOUNTER — Ambulatory Visit: Payer: MEDICAID

## 2024-05-11 DIAGNOSIS — R262 Difficulty in walking, not elsewhere classified: Secondary | ICD-10-CM

## 2024-05-11 DIAGNOSIS — M25671 Stiffness of right ankle, not elsewhere classified: Secondary | ICD-10-CM
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
## 2024-05-16 ENCOUNTER — Ambulatory Visit: Payer: MEDICAID

## 2024-05-18 ENCOUNTER — Ambulatory Visit: Payer: MEDICAID

## 2024-05-23 ENCOUNTER — Ambulatory Visit: Payer: MEDICAID

## 2024-05-25 ENCOUNTER — Ambulatory Visit: Payer: MEDICAID

## 2024-05-26 ENCOUNTER — Encounter: Payer: Self-pay | Admitting: *Deleted

## 2024-06-13 ENCOUNTER — Ambulatory Visit: Payer: MEDICAID
